# Patient Record
Sex: Male | Born: 1975
Health system: Southern US, Community
[De-identification: ages and names within clinical notes are randomized; demographics above are authoritative.]

## PROBLEM LIST (undated history)

## (undated) DIAGNOSIS — J349 Unspecified disorder of nose and nasal sinuses: Secondary | ICD-10-CM

## (undated) DIAGNOSIS — J329 Chronic sinusitis, unspecified: Secondary | ICD-10-CM

## (undated) DIAGNOSIS — R310 Gross hematuria: Secondary | ICD-10-CM

## (undated) DIAGNOSIS — R9431 Abnormal electrocardiogram [ECG] [EKG]: Secondary | ICD-10-CM

## (undated) DIAGNOSIS — G4733 Obstructive sleep apnea (adult) (pediatric): Secondary | ICD-10-CM

## (undated) HISTORY — PX: WISDOM TOOTH EXTRACTION: SHX21

## (undated) HISTORY — DX: Gross hematuria: R31.0

## (undated) HISTORY — DX: Obstructive sleep apnea (adult) (pediatric): G47.33

## (undated) HISTORY — DX: Abnormal electrocardiogram (ECG) (EKG): R94.31

## (undated) HISTORY — DX: Unspecified disorder of nose and nasal sinuses: J34.9

## (undated) HISTORY — PX: PILONIDAL CYST DRAINAGE: SHX743

---

## 2012-01-03 DIAGNOSIS — G4733 Obstructive sleep apnea (adult) (pediatric): Secondary | ICD-10-CM | POA: Insufficient documentation

## 2015-07-06 DIAGNOSIS — R310 Gross hematuria: Secondary | ICD-10-CM

## 2015-07-06 HISTORY — DX: Gross hematuria: R31.0

## 2015-09-24 DIAGNOSIS — K219 Gastro-esophageal reflux disease without esophagitis: Secondary | ICD-10-CM | POA: Insufficient documentation

## 2015-09-24 DIAGNOSIS — G2581 Restless legs syndrome: Secondary | ICD-10-CM | POA: Insufficient documentation

## 2015-10-23 DIAGNOSIS — G4733 Obstructive sleep apnea (adult) (pediatric): Secondary | ICD-10-CM | POA: Diagnosis not present

## 2015-12-04 DIAGNOSIS — G4733 Obstructive sleep apnea (adult) (pediatric): Secondary | ICD-10-CM

## 2015-12-04 HISTORY — DX: Obstructive sleep apnea (adult) (pediatric): G47.33

## 2015-12-09 DIAGNOSIS — G4733 Obstructive sleep apnea (adult) (pediatric): Secondary | ICD-10-CM | POA: Diagnosis not present

## 2015-12-09 DIAGNOSIS — Z9989 Dependence on other enabling machines and devices: Secondary | ICD-10-CM | POA: Diagnosis not present

## 2016-06-07 ENCOUNTER — Ambulatory Visit (INDEPENDENT_AMBULATORY_CARE_PROVIDER_SITE_OTHER): Payer: BLUE CROSS/BLUE SHIELD | Admitting: Family Medicine

## 2016-06-07 ENCOUNTER — Encounter: Payer: Self-pay | Admitting: Family Medicine

## 2016-06-07 VITALS — BP 140/78 | HR 82 | Temp 97.8°F | Resp 16 | Ht 71.5 in | Wt 224.5 lb

## 2016-06-07 DIAGNOSIS — Z23 Encounter for immunization: Secondary | ICD-10-CM

## 2016-06-07 DIAGNOSIS — R310 Gross hematuria: Secondary | ICD-10-CM

## 2016-06-07 DIAGNOSIS — Z0000 Encounter for general adult medical examination without abnormal findings: Secondary | ICD-10-CM

## 2016-06-07 LAB — URINALYSIS, ROUTINE W REFLEX MICROSCOPIC
Bilirubin Urine: NEGATIVE
Ketones, ur: NEGATIVE
Leukocytes, UA: NEGATIVE
Nitrite: NEGATIVE
PH: 5 (ref 5.0–8.0)
TOTAL PROTEIN, URINE-UPE24: NEGATIVE
Urine Glucose: NEGATIVE
Urobilinogen, UA: 0.2 (ref 0.0–1.0)

## 2016-06-07 LAB — POCT URINALYSIS DIPSTICK
Bilirubin, UA: NEGATIVE
GLUCOSE UA: NEGATIVE
Ketones, UA: NEGATIVE
LEUKOCYTES UA: NEGATIVE
NITRITE UA: NEGATIVE
PH UA: 5
Protein, UA: NEGATIVE
Spec Grav, UA: >=1.03
Urobilinogen, UA: 0.2

## 2016-06-07 NOTE — Progress Notes (Signed)
Office Note 06/07/2016  CC:  Chief Complaint  Patient presents with  . Establish Care  . Shoulder Pain    right, off and on x 1 year    HPI:  Danny Marsh is a 40 y.o. White male who is here to establish care. Patient's [facility], [place], [place]. Old records were not reviewed prior to or during today's visit.  No known FH of colon ca, bladder cancer, or prostate ca.  Pt reports hx of seeing blood in urine after he does a heavy workout---has occurred about 5 distinct times (all the same scenario).  No dysuria, no flank or groin pain. These episodes have all been over the last several months.  Past Medical History:  Diagnosis Date  . OSA (obstructive sleep apnea) 12/2015   Oral appliance works well    Past Surgical History:  Procedure Laterality Date  . PILONIDAL CYST DRAINAGE     x 2    Family History  Problem Relation Age of Onset  . Stroke Neg Hx   . Hearing loss Neg Hx   . Diabetes Neg Hx   . Cancer Neg Hx     Social History   Social History  . Marital status: Married    Spouse name: N/A  . Number of children: N/A  . Years of education: N/A   Occupational History  . Not on file.   Social History Main Topics  . Smoking status: Current Some Day Smoker    Types: Cigars  . Smokeless tobacco: Never Used  . Alcohol use Yes     Comment: 2-3 drinks a week, wine or beer  . Drug use: No  . Sexual activity: Not on file   Other Topics Concern  . Not on file   Social History Narrative   Married, 1 son.   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   Tob: "social smoker" x 10 yrs, about 1 pack per week.   Alc: 2-3 drinks per week, wine or beer.    No outpatient encounter prescriptions on file as of 06/07/2016.   No facility-administered encounter medications on file as of 06/07/2016.     No Known Allergies  ROS Review of Systems  Constitutional: Negative for appetite change, chills, fatigue and  fever.  HENT: Negative for congestion, dental problem, ear pain and sore throat.   Eyes: Negative for discharge, redness and visual disturbance.  Respiratory: Negative for cough, chest tightness, shortness of breath and wheezing.   Cardiovascular: Negative for chest pain, palpitations and leg swelling.  Gastrointestinal: Negative for abdominal pain, blood in stool, diarrhea, nausea and vomiting.  Genitourinary: Positive for hematuria. Negative for difficulty urinating, dysuria, flank pain, frequency and urgency.  Musculoskeletal: Negative for arthralgias, back pain, joint swelling, myalgias and neck stiffness.  Skin: Negative for pallor and rash.  Neurological: Negative for dizziness, speech difficulty, weakness and headaches.  Hematological: Negative for adenopathy. Does not bruise/bleed easily.  Psychiatric/Behavioral: Negative for confusion and sleep disturbance. The patient is not nervous/anxious.     PE; Blood pressure 140/78, pulse 82, temperature 97.8 F (36.6 C), temperature source Oral, resp. rate 16, height 5' 11.5" (1.816 m), weight 224 lb 8 oz (101.8 kg), SpO2 96 %. Gen: Alert, well appearing.  Patient is oriented to person, place, time, and situation. AFFECT: pleasant, lucid thought and speech. ENT: Ears: EACs clear, normal epithelium.  TMs with good light reflex and landmarks bilaterally.  Eyes: no injection, icteris, swelling, or exudate.  EOMI, PERRLA. Nose: no drainage or turbinate edema/swelling.  No injection or focal lesion.  Mouth: lips without lesion/swelling.  Oral mucosa pink and moist.  Dentition intact and without obvious caries or gingival swelling.  Oropharynx without erythema, exudate, or swelling.  Neck: supple/nontender.  No LAD, mass, or TM.  Carotid pulses 2+ bilaterally, without bruits. CV: RRR, no m/r/g.   LUNGS: CTA bilat, nonlabored resps, good aeration in all lung fields. ABD: soft, NT, ND, BS normal.  No hepatospenomegaly or mass.  No bruits. EXT: no  clubbing, cyanosis, or edema.  Musculoskeletal: no joint swelling, erythema, warmth, or tenderness.  ROM of all joints intact. Skin - no sores or suspicious lesions or rashes or color changes  Pertinent labs:   CC UA today: moderate blood, SG >1.030, otherwise normal  ASSESSMENT AND PLAN:   New pt; obtain prior PCP records.  1) Gross and microscopic hematuria: Arrange renal u/s. Pt wants to get in with urol before 06/18/16 due to travel and insurance reasons: will order referral and try to accomodate this. Send urine to lab today for microscopy. Hematuria due to vigorous exercise is dx of exclusion for this patient.  2) Health maintenance exam:  Reviewed age and gender appropriate health maintenance issues (prudent diet, regular exercise, health risks of tobacco and excessive alcohol, use of seatbelts, fire alarms in home, use of sunscreen).  Also reviewed age and gender appropriate health screening as well as vaccine recommendations. Tdap today.  Flu already UTD. Return for fasting HP labs--ordered.  An After Visit Summary was printed and given to the patient.  Return for follow up as needed.  Signed:  Santiago BumpersPhil McGowen, MD           06/07/2016

## 2016-06-07 NOTE — Progress Notes (Signed)
Pre visit review using our clinic review tool, if applicable. No additional management support is needed unless otherwise documented below in the visit note. 

## 2016-06-10 ENCOUNTER — Other Ambulatory Visit (INDEPENDENT_AMBULATORY_CARE_PROVIDER_SITE_OTHER): Payer: BLUE CROSS/BLUE SHIELD

## 2016-06-10 DIAGNOSIS — Z0000 Encounter for general adult medical examination without abnormal findings: Secondary | ICD-10-CM

## 2016-06-10 LAB — CBC WITH DIFFERENTIAL/PLATELET
BASOS ABS: 0 10*3/uL (ref 0.0–0.1)
BASOS PCT: 0.4 % (ref 0.0–3.0)
EOS PCT: 3.1 % (ref 0.0–5.0)
Eosinophils Absolute: 0.2 10*3/uL (ref 0.0–0.7)
HCT: 45.9 % (ref 39.0–52.0)
Hemoglobin: 15.6 g/dL (ref 13.0–17.0)
LYMPHS ABS: 2.8 10*3/uL (ref 0.7–4.0)
Lymphocytes Relative: 38.2 % (ref 12.0–46.0)
MCHC: 34.1 g/dL (ref 30.0–36.0)
MCV: 93.7 fl (ref 78.0–100.0)
MONOS PCT: 6.7 % (ref 3.0–12.0)
Monocytes Absolute: 0.5 10*3/uL (ref 0.1–1.0)
NEUTROS ABS: 3.8 10*3/uL (ref 1.4–7.7)
NEUTROS PCT: 51.6 % (ref 43.0–77.0)
PLATELETS: 253 10*3/uL (ref 150.0–400.0)
RBC: 4.9 Mil/uL (ref 4.22–5.81)
RDW: 12.8 % (ref 11.5–15.5)
WBC: 7.3 10*3/uL (ref 4.0–10.5)

## 2016-06-10 LAB — COMPREHENSIVE METABOLIC PANEL
ALBUMIN: 4.5 g/dL (ref 3.5–5.2)
ALK PHOS: 86 U/L (ref 39–117)
ALT: 26 U/L (ref 0–53)
AST: 22 U/L (ref 0–37)
BILIRUBIN TOTAL: 0.9 mg/dL (ref 0.2–1.2)
BUN: 15 mg/dL (ref 6–23)
CO2: 33 mEq/L — ABNORMAL HIGH (ref 19–32)
Calcium: 9.8 mg/dL (ref 8.4–10.5)
Chloride: 103 mEq/L (ref 96–112)
Creatinine, Ser: 1.03 mg/dL (ref 0.40–1.50)
GFR: 85.07 mL/min (ref >60.00)
GLUCOSE: 90 mg/dL (ref 70–99)
Potassium: 4.3 mEq/L (ref 3.5–5.1)
Sodium: 142 mEq/L (ref 135–145)
TOTAL PROTEIN: 7 g/dL (ref 6.0–8.3)

## 2016-06-10 LAB — LIPID PANEL
CHOLESTEROL: 174 mg/dL (ref 0–200)
HDL: 36.6 mg/dL — AB (ref >39.00)
LDL Cholesterol: 111 mg/dL — ABNORMAL HIGH (ref 0–99)
NONHDL: 137.26
TRIGLYCERIDES: 133 mg/dL (ref 0.0–149.0)
Total CHOL/HDL Ratio: 5
VLDL: 26.6 mg/dL (ref 0.0–40.0)

## 2016-06-10 LAB — TSH: TSH: 1.66 u[IU]/mL (ref 0.35–4.50)

## 2016-06-11 ENCOUNTER — Encounter: Payer: Self-pay | Admitting: Family Medicine

## 2016-06-15 ENCOUNTER — Ambulatory Visit (HOSPITAL_BASED_OUTPATIENT_CLINIC_OR_DEPARTMENT_OTHER)
Admission: RE | Admit: 2016-06-15 | Discharge: 2016-06-15 | Disposition: A | Discharge status: Home / Self Care | Payer: BLUE CROSS/BLUE SHIELD | Source: Ambulatory Visit | Attending: Family Medicine | Admitting: Family Medicine

## 2016-06-15 DIAGNOSIS — R310 Gross hematuria: Secondary | ICD-10-CM | POA: Insufficient documentation

## 2016-06-16 ENCOUNTER — Encounter: Payer: Self-pay | Admitting: Family Medicine

## 2016-06-17 ENCOUNTER — Encounter: Payer: Self-pay | Admitting: Family Medicine

## 2016-07-19 ENCOUNTER — Telehealth: Payer: Self-pay | Admitting: Family Medicine

## 2016-07-19 DIAGNOSIS — I4581 Long QT syndrome: Secondary | ICD-10-CM

## 2016-07-19 NOTE — Telephone Encounter (Signed)
Patient was made aware he could have a variation of Long QT syndrome around 25 years ago and because it is a genetic disease, patient would like to do further testing so that he can get his new born tested.  Patient would like a referral to DR Delrae RendJagadeesh Ganji, Cardiologist.  Okay for referral?

## 2016-07-19 NOTE — Telephone Encounter (Signed)
OK, referral ordered 

## 2016-08-06 DIAGNOSIS — R002 Palpitations: Secondary | ICD-10-CM | POA: Diagnosis not present

## 2016-08-09 ENCOUNTER — Encounter: Payer: Self-pay | Admitting: Family Medicine

## 2016-09-02 ENCOUNTER — Telehealth: Payer: Self-pay | Admitting: Family Medicine

## 2016-09-02 NOTE — Telephone Encounter (Signed)
Left message for patient to return call with specialist he prefers to see.

## 2016-09-02 NOTE — Telephone Encounter (Signed)
Patient would like to discuss additional options regarding the Urology referral Dr. Milinda CaveMcGowen placed for him a while ago.   He was not able to make his appointments with the Urology location and wants to consider a different one.  Thank you,  -LL

## 2016-09-07 NOTE — Telephone Encounter (Signed)
SW pt, he was unable to talk at this time, will call back later.

## 2016-09-07 NOTE — Telephone Encounter (Signed)
SW pt and he stated that he would like to be referred to a different urology office, currently referral is to [facility]. He stated that he is having scheduling problems with them and would like to go to a different urologist. He stated that his 1st apt was cancelled because office closed due to snow and when he called to reschedule they put his apt out another 6 weeks, then his work needed him to go out of town the week of his rescheduled apt so he called to reschedule and they said they couldn't get him in for another 6 weeks. Please advise. Thanks.   He stated that he will not be able to answer his phone until Thursday next week but its okay to leave a detailed message on his cel vm.

## 2016-09-08 NOTE — Telephone Encounter (Signed)
Can you try to get him in to see Dr. Edwyna ShellHart in [place]? If it is going to be > 6 wks to see Dr. Edwyna ShellHart, then pls try Urologist with [facility] in [place].-thx

## 2016-09-14 NOTE — Telephone Encounter (Signed)
Great, thank you!

## 2016-09-14 NOTE — Telephone Encounter (Signed)
I got him in with Dr. Edwyna ShellHart 10/07/16.

## 2016-09-22 ENCOUNTER — Ambulatory Visit (INDEPENDENT_AMBULATORY_CARE_PROVIDER_SITE_OTHER): Payer: BLUE CROSS/BLUE SHIELD | Admitting: Family Medicine

## 2016-09-22 ENCOUNTER — Encounter: Payer: Self-pay | Admitting: Family Medicine

## 2016-09-22 VITALS — BP 120/86 | HR 84 | Temp 98.3°F | Resp 16 | Ht 71.5 in | Wt 224.8 lb

## 2016-09-22 DIAGNOSIS — L249 Irritant contact dermatitis, unspecified cause: Secondary | ICD-10-CM | POA: Diagnosis not present

## 2016-09-22 MED ORDER — FLUTICASONE PROPIONATE 0.05 % EX CREA: TOPICAL_CREAM | CUTANEOUS | 1 refills | Status: DC

## 2016-09-22 NOTE — Progress Notes (Signed)
OFFICE VISIT  09/22/2016   CC:  Chief Complaint  Patient presents with  . Rash    on right leg x 5 days, gets worse after showering    HPI:    Patient is a 41 y.o.  male who presents for rash.   Rash noted on medial aspect of right knee noted about 5 days ago.  Very itchy, worse after shower. Extends up medial thigh some.  Applying otc hydrocortisone regularly and this does help temporarily. Returned from [place] 9[place]a.  No known contact with the typical contact irritants or allergens.  Past Medical History:  Diagnosis Date  . Abnormal EKG    Unclear hx: when pt approx 19 he had ? long QT.  Subsequent expert opinions supported dx of normal varian--no QT prolongation.  Eval by Dr. Jacinto HalimGanji again 08/06/16 NORMAL--no long QT syndrome (ekg age 41 likely PVC w/ early repol--normal variant for age)  . Gross hematuria 2017   Multiple episodes after vigorous exercise (myoglobinuria?).  Renal u/s 06/15/16 showed very small bilat kidney stones, nonobstructing.   . OSA (obstructive sleep apnea) 12/2015   Oral appliance works well    Past Surgical History:  Procedure Laterality Date  . PILONIDAL CYST DRAINAGE     x 2    No outpatient prescriptions prior to visit.   No facility-administered medications prior to visit.     No Known Allergies  ROS As per HPI  PE: Blood pressure 120/86, pulse 84, temperature 98.3 F (36.8 C), temperature source Oral, resp. rate 16, height 5' 11.5" (1.816 m), weight 224 lb 12 oz (101.9 kg), SpO2 97 %. Gen: Alert, well appearing.  Patient is oriented to person, place, time, and situation. AFFECT: pleasant, lucid thought and speech. SKIN: medial aspect of left knee and distal thigh with sparsely scattered pink papules about 1 mm in size. Some appear to be at the base of hair follicle but many others do not.  Some blanching noted with pressure.  No pustules or vesicles.  Non-tender to touch.  No streaking, no excessive warmth.  No background erythema.  No  excoriations.  LABS:  none  IMPRESSION AND PLAN:  Nonspecific dermatitis, likely to contact irritant pt unknowingly came into contact with. Rx'd generic cutivate 0.05% cream to apply bid to affected area. Signs/symptoms to call or return for were reviewed and pt expressed understanding.  An After Visit Summary was printed and given to the patient.  FOLLOW UP: Return if symptoms worsen or fail to improve.  Signed:  Santiago BumpersPhil Kathrin Folden, MD           09/22/2016

## 2016-09-22 NOTE — Progress Notes (Signed)
Pre visit review using our clinic review tool, if applicable. No additional management support is needed unless otherwise documented below in the visit note. 

## 2016-10-07 DIAGNOSIS — N200 Calculus of kidney: Secondary | ICD-10-CM | POA: Diagnosis not present

## 2016-10-07 DIAGNOSIS — R3129 Other microscopic hematuria: Secondary | ICD-10-CM | POA: Diagnosis not present

## 2016-10-14 ENCOUNTER — Telehealth: Payer: Self-pay | Admitting: Family Medicine

## 2016-10-14 ENCOUNTER — Encounter: Payer: Self-pay | Admitting: Family Medicine

## 2016-10-14 NOTE — Telephone Encounter (Signed)
Please advise. Thanks.  

## 2016-10-14 NOTE — Telephone Encounter (Signed)
Letter printed.

## 2016-10-14 NOTE — Telephone Encounter (Signed)
Patient states that his life insurance policy is going up due to our note stating that he is a current smoker x 10 year ( one pack weekly ).  Patient states that is not accurate.  He states that he has stopped smoking cigarettes 10 years ago and it was less than a pack weekly.  He also states he does smoke cigar but only socially maybe 2-3 times a year.  Can this please be updated and placed on our letter head.   Patient would like to pick this letter up when it is ready.  Please advise.

## 2016-10-14 NOTE — Telephone Encounter (Signed)
Patient advised.

## 2016-11-01 DIAGNOSIS — N281 Cyst of kidney, acquired: Secondary | ICD-10-CM | POA: Diagnosis not present

## 2016-11-01 DIAGNOSIS — R3129 Other microscopic hematuria: Secondary | ICD-10-CM | POA: Diagnosis not present

## 2016-11-01 DIAGNOSIS — R319 Hematuria, unspecified: Secondary | ICD-10-CM | POA: Diagnosis not present

## 2016-11-17 DIAGNOSIS — R3129 Other microscopic hematuria: Secondary | ICD-10-CM | POA: Diagnosis not present

## 2016-11-17 DIAGNOSIS — N281 Cyst of kidney, acquired: Secondary | ICD-10-CM | POA: Diagnosis not present

## 2016-12-17 DIAGNOSIS — Z87448 Personal history of other diseases of urinary system: Secondary | ICD-10-CM | POA: Diagnosis not present

## 2016-12-17 DIAGNOSIS — N281 Cyst of kidney, acquired: Secondary | ICD-10-CM | POA: Diagnosis not present

## 2016-12-17 DIAGNOSIS — R319 Hematuria, unspecified: Secondary | ICD-10-CM | POA: Diagnosis not present

## 2017-05-10 ENCOUNTER — Ambulatory Visit (INDEPENDENT_AMBULATORY_CARE_PROVIDER_SITE_OTHER): Payer: BLUE CROSS/BLUE SHIELD

## 2017-05-10 DIAGNOSIS — Z23 Encounter for immunization: Secondary | ICD-10-CM | POA: Diagnosis not present

## 2017-06-28 DIAGNOSIS — J069 Acute upper respiratory infection, unspecified: Secondary | ICD-10-CM | POA: Diagnosis not present

## 2017-06-29 DIAGNOSIS — J0100 Acute maxillary sinusitis, unspecified: Secondary | ICD-10-CM | POA: Diagnosis not present

## 2017-07-12 ENCOUNTER — Encounter: Payer: Self-pay | Admitting: Family Medicine

## 2017-07-12 ENCOUNTER — Ambulatory Visit (INDEPENDENT_AMBULATORY_CARE_PROVIDER_SITE_OTHER): Payer: BLUE CROSS/BLUE SHIELD | Admitting: Family Medicine

## 2017-07-12 VITALS — BP 104/72 | HR 92 | Temp 97.9°F | Wt 219.8 lb

## 2017-07-12 DIAGNOSIS — J0100 Acute maxillary sinusitis, unspecified: Secondary | ICD-10-CM

## 2017-07-12 MED ORDER — DOXYCYCLINE HYCLATE 100 MG PO TABS: 100.0000 mg | ORAL_TABLET | Freq: Two times a day (BID) | ORAL | 0 refills | Status: DC

## 2017-07-12 MED ORDER — BENZONATATE 200 MG PO CAPS: 200.0000 mg | ORAL_CAPSULE | Freq: Two times a day (BID) | ORAL | 0 refills | Status: DC | PRN

## 2017-07-12 NOTE — Progress Notes (Signed)
Danny Marsh , 08-01-1975, 42 y.o., male MRN: [id number] Patient Care Team    Relationship Specialty Notifications Start End  Danny Marsh, Danny Morn, MD PCP - General Family Medicine  06/07/16   Danny Decamp, MD Consulting Physician Cardiology  08/09/16     Chief Complaint  Patient presents with  . URI    pts complain of cough of productive cough that started on sunday after his resolve sinus infection that lasted for a week     Subjective: Pt presents for an OV with complaints of cough since Dec. 24th. He reports he had a sinus infection that was treated at [facility] with amox BID dosing. he feels his symptoms started to improve but after medications completed he is now experiencing worsening symptoms and productive cough and chills. He originally tried flonase and sudafed, but stop taking when he started abx.   Depression screen PHQ 2/9 07/12/2017  Decreased Interest 0  Down, Depressed, Hopeless 0  PHQ - 2 Score 0    No Known Allergies Social History   Tobacco Use  . Smoking status: Current Some Day Smoker    Types: Cigars  . Smokeless tobacco: Never Used  Substance Use Topics  . Alcohol use: Yes    Comment: 2-3 drinks a week, wine or beer   Past Medical History:  Diagnosis Date  . Abnormal EKG    Unclear hx: when pt approx 19 he had ? long QT.  Subsequent expert opinions supported dx of normal varian--no QT prolongation.  Eval by Dr. Jacinto Marsh again 08/06/16 NORMAL--no long QT syndrome (ekg age 29 likely PVC w/ early repol--normal variant for age)  . Gross hematuria 2017   Multiple episodes after vigorous exercise (myoglobinuria?).  Renal u/s 06/15/16 showed very small bilat kidney stones, nonobstructing.   . OSA (obstructive sleep apnea) 12/2015   Oral appliance works well   Past Surgical History:  Procedure Laterality Date  . PILONIDAL CYST DRAINAGE     x 2   Family History  Problem Relation Age of Onset  . Stroke Neg Hx   . Hearing loss Neg Hx   . Diabetes Neg Hx   . Cancer Neg  Hx    Allergies as of 07/12/2017   No Known Allergies     Medication List        Accurate as of 07/12/17 12:59 PM. Always use your most recent med list.          fluticasone 0.05 % cream Commonly known as:  CUTIVATE Apply to affected area twice a day as needed       All past medical history, surgical history, allergies, family history, immunizations andmedications were updated in the EMR today and reviewed under the history and medication portions of their EMR.     ROS: Negative, with the exception of above mentioned in HPI   Objective:  BP 104/72 (BP Location: Right Arm, Patient Position: Sitting, Cuff Size: Large)   Pulse 92   Temp 97.9 F (36.6 C) (Oral)   Wt 219 lb 12.8 oz (99.7 kg)   SpO2 95%   BMI 30.23 kg/m  Body mass index is 30.23 kg/m. Gen: Afebrile. No acute distress. Nontoxic in appearance, well developed, well nourished.  HENT: AT. [place]. Bilateral TM visualized without erythema or bulging. MMM, no oral lesions. Bilateral nares with erythema, swelling and drainage. Throat without erythema or exudates. No cough on exam. Mild hoarseness. TTP right max sinus.  Eyes:Pupils Equal Round Reactive to light, Extraocular movements intact,  Conjunctiva without redness, discharge or icterus. Neck/lymp/endocrine: Supple,no lymphadenopathy CV: RRR  Chest: CTAB, no wheeze or crackles. Good air movement, normal resp effort.  Abd: Soft. NTND. BS present. Skin: no rashes, purpura or petechiae.  Neuro:  Normal gait. PERLA. EOMi. Alert. Oriented x3   No exam data present No results found. No results found for this or any previous visit (from the past 24 hour(s)).  Assessment/Plan: Danny Marsh is a 42 y.o. male present for OV for  Acute non-recurrent maxillary sinusitis Rest, hydrate.  + flonase, mucinex (DM if cough), nettie pot or nasal saline.  doxycyline prescribed, take until completed. tessalon perles for cough.  If cough present it can last up to 6-8 weeks.  F/U 2  weeks if not improved.    Reviewed expectations re: course of current medical issues.  Discussed self-management of symptoms.  Outlined signs and symptoms indicating need for more acute intervention.  Patient verbalized understanding and all questions were answered.  Patient received an After-Visit Summary.    No orders of the defined types were placed in this encounter.    Note is dictated utilizing voice recognition software. Although note has been proof read prior to signing, occasional typographical errors still can be missed. If any questions arise, please do not hesitate to call for verification.   electronically signed by:  Danny Pacinienee Danny Casserly, DO  [facility] - [place]

## 2017-07-12 NOTE — Patient Instructions (Signed)
Rest, hydrate.  + flonase, mucinex (DM if cough), nettie pot or nasal saline.  doxycyline prescribed, take until completed. tessalon perles for cough.  If cough present it can last up to 6-8 weeks.  F/U 2 weeks of not improved.     Sinusitis, Adult Sinusitis is soreness and inflammation of your sinuses. Sinuses are hollow spaces in the bones around your face. They are located:  Around your eyes.  In the middle of your forehead.  Behind your nose.  In your cheekbones.  Your sinuses and nasal passages are lined with a stringy fluid (mucus). Mucus normally drains out of your sinuses. When your nasal tissues get inflamed or swollen, the mucus can get trapped or blocked so air cannot flow through your sinuses. This lets bacteria, viruses, and funguses grow, and that leads to infection. Follow these instructions at home: Medicines  Take, use, or apply over-the-counter and prescription medicines only as told by your doctor. These may include nasal sprays.  If you were prescribed an antibiotic medicine, take it as told by your doctor. Do not stop taking the antibiotic even if you start to feel better. Hydrate and Humidify  Drink enough water to keep your pee (urine) clear or pale yellow.  Use a cool mist humidifier to keep the humidity level in your home above 50%.  Breathe in steam for 10-15 minutes, 3-4 times a day or as told by your doctor. You can do this in the bathroom while a hot shower is running.  Try not to spend time in cool or dry air. Rest  Rest as much as possible.  Sleep with your head raised (elevated).  Make sure to get enough sleep each night. General instructions  Put a warm, moist washcloth on your face 3-4 times a day or as told by your doctor. This will help with discomfort.  Wash your hands often with soap and water. If there is no soap and water, use hand sanitizer.  Do not smoke. Avoid being around people who are smoking (secondhand smoke).  Keep all  follow-up visits as told by your doctor. This is important. Contact a doctor if:  You have a fever.  Your symptoms get worse.  Your symptoms do not get better within 10 days. Get help right away if:  You have a very bad headache.  You cannot stop throwing up (vomiting).  You have pain or swelling around your face or eyes.  You have trouble seeing.  You feel confused.  Your neck is stiff.  You have trouble breathing. This information is not intended to replace advice given to you by your health care provider. Make sure you discuss any questions you have with your health care provider. Document Released: 12/08/2007 Document Revised: 02/15/2016 Document Reviewed: 04/16/2015 Elsevier Interactive Patient Education  [employer]2018 Elsevier Inc.

## 2017-12-19 ENCOUNTER — Encounter: Payer: Self-pay | Admitting: Family Medicine

## 2017-12-19 ENCOUNTER — Ambulatory Visit (INDEPENDENT_AMBULATORY_CARE_PROVIDER_SITE_OTHER): Payer: BLUE CROSS/BLUE SHIELD | Admitting: Family Medicine

## 2017-12-19 VITALS — BP 119/81 | HR 82 | Temp 98.0°F | Resp 20 | Ht 71.5 in | Wt 217.5 lb

## 2017-12-19 DIAGNOSIS — J0100 Acute maxillary sinusitis, unspecified: Secondary | ICD-10-CM | POA: Diagnosis not present

## 2017-12-19 MED ORDER — PREDNISONE 50 MG PO TABS: 50.0000 mg | ORAL_TABLET | Freq: Every day | ORAL | 0 refills | Status: DC

## 2017-12-19 MED ORDER — DOXYCYCLINE HYCLATE 100 MG PO TABS: 100.0000 mg | ORAL_TABLET | Freq: Two times a day (BID) | ORAL | 0 refills | Status: DC

## 2017-12-19 NOTE — Progress Notes (Signed)
Danny Marsh , September 30, 1975, 42 y.o., male MRN: [id number] Patient Care Team    Relationship Specialty Notifications Start End  McGowen, Maryjean MornPhilip H, MD PCP - General Family Medicine  06/07/16   Yates DecampGanji, Jay, MD Consulting Physician Cardiology  08/09/16     Chief Complaint  Patient presents with  . URI    headache,odor from nose, facial pressure      Subjective: Pt presents for an OV with complaints of facial pressure of 10 days duration.  Associated symptoms include headache, metallic odor in nose, nasal congestion. He is eating and drinking well.  He denies fever, chills, nausea or vomit. He had a sinus infection in January resistant to Augmentin, did well with doxy.   Pt has tried sudafed, robitussin DM to ease their symptoms.   Depression screen PHQ 2/9 07/12/2017  Decreased Interest 0  Down, Depressed, Hopeless 0  PHQ - 2 Score 0    No Known Allergies Social History   Tobacco Use  . Smoking status: Current Some Day Smoker    Types: Cigars  . Smokeless tobacco: Never Used  Substance Use Topics  . Alcohol use: Yes    Comment: 2-3 drinks a week, wine or beer   Past Medical History:  Diagnosis Date  . Abnormal EKG    Unclear hx: when pt approx 19 he had ? long QT.  Subsequent expert opinions supported dx of normal varian--no QT prolongation.  Eval by Dr. Jacinto HalimGanji again 08/06/16 NORMAL--no long QT syndrome (ekg age 42 likely PVC w/ early repol--normal variant for age)  . Gross hematuria 2017   Multiple episodes after vigorous exercise (myoglobinuria?).  Renal u/s 06/15/16 showed very small bilat kidney stones, nonobstructing.   . OSA (obstructive sleep apnea) 12/2015   Oral appliance works well   Past Surgical History:  Procedure Laterality Date  . PILONIDAL CYST DRAINAGE     x 2   Family History  Problem Relation Age of Onset  . Stroke Neg Hx   . Hearing loss Neg Hx   . Diabetes Neg Hx   . Cancer Neg Hx    Allergies as of 12/19/2017   No Known Allergies     Medication  List    as of 12/19/2017  9:20 AM   You have not been prescribed any medications.     All past medical history, surgical history, allergies, family history, immunizations andmedications were updated in the EMR today and reviewed under the history and medication portions of their EMR.     ROS: Negative, with the exception of above mentioned in HPI   Objective:  BP 119/81 (BP Location: Left Arm, Patient Position: Sitting, Cuff Size: Large)   Pulse 82   Temp 98 F (36.7 C)   Resp 20   Ht 5' 11.5" (1.816 m)   Wt 217 lb 8 oz (98.7 kg)   SpO2 97%   BMI 29.91 kg/m  Body mass index is 29.91 kg/m. Gen: Afebrile. No acute distress. Nontoxic in appearance, well developed, well nourished.  HENT: AT. [place]. Bilateral TM visualized without erythema or fullness. MMM, no oral lesions. Bilateral nares with large red turbinates, drainage and swelling, mild odor present. Throat without erythema or exudates. PND present. TTP right max sinus with fullness.  Eyes:Pupils Equal Round Reactive to light, Extraocular movements intact,  Conjunctiva without redness, discharge or icterus. Neck/lymp/endocrine: Supple,no lymphadenopathy CV: RRR no murmur Chest: CTAB, no wheeze or crackles. Good air movement, normal resp effort.  Abd: Soft. NTND. BS present.  Skin: no rashes, purpura or petechiae.  Neuro:  Normal gait. PERLA. EOMi. Alert. Oriented x3    No exam data present No results found. No results found for this or any previous visit (from the past 24 hour(s)).  Assessment/Plan: Danny Marsh is a 42 y.o. male present for OV for  Acute non-recurrent maxillary sinusitis Rest, hydrate.  + flonase, mucinex (DM if cough),  nasal saline (flushes 3x a day while ill)  Doxycyline/predniosne prescribed, take until completed.  If cough present it can last up to 6-8 weeks.  F/U 2 weeks of not improved. If still having discomfort or odor, would consider ENT referral vs sinus xray.   Reviewed expectations re:  course of current medical issues.  Discussed self-management of symptoms.  Outlined signs and symptoms indicating need for more acute intervention.  Patient verbalized understanding and all questions were answered.  Patient received an After-Visit Summary.    No orders of the defined types were placed in this encounter.    Note is dictated utilizing voice recognition software. Although note has been proof read prior to signing, occasional typographical errors still can be missed. If any questions arise, please do not hesitate to call for verification.   electronically signed by:  Felix Pacini, DO   [facility] - [place]

## 2017-12-19 NOTE — Patient Instructions (Addendum)
Rest, hydrate.  + flonase, mucinex (DM if cough),  nasal saline (flushes 3x a day while ill)  Doxycyline  prescribed, take until completed.  If cough present it can last up to 6-8 weeks.  F/U 2 weeks of not improved.    Sinusitis, Adult Sinusitis is soreness and inflammation of your sinuses. Sinuses are hollow spaces in the bones around your face. They are located:  Around your eyes.  In the middle of your forehead.  Behind your nose.  In your cheekbones.  Your sinuses and nasal passages are lined with a stringy fluid (mucus). Mucus normally drains out of your sinuses. When your nasal tissues get inflamed or swollen, the mucus can get trapped or blocked so air cannot flow through your sinuses. This lets bacteria, viruses, and funguses grow, and that leads to infection. Follow these instructions at home: Medicines  Take, use, or apply over-the-counter and prescription medicines only as told by your doctor. These may include nasal sprays.  If you were prescribed an antibiotic medicine, take it as told by your doctor. Do not stop taking the antibiotic even if you start to feel better. Hydrate and Humidify  Drink enough water to keep your pee (urine) clear or pale yellow.  Use a cool mist humidifier to keep the humidity level in your home above 50%.  Breathe in steam for 10-15 minutes, 3-4 times a day or as told by your doctor. You can do this in the bathroom while a hot shower is running.  Try not to spend time in cool or dry air. Rest  Rest as much as possible.  Sleep with your head raised (elevated).  Make sure to get enough sleep each night. General instructions  Put a warm, moist washcloth on your face 3-4 times a day or as told by your doctor. This will help with discomfort.  Wash your hands often with soap and water. If there is no soap and water, use hand sanitizer.  Do not smoke. Avoid being around people who are smoking (secondhand smoke).  Keep all follow-up  visits as told by your doctor. This is important. Contact a doctor if:  You have a fever.  Your symptoms get worse.  Your symptoms do not get better within 10 days. Get help right away if:  You have a very bad headache.  You cannot stop throwing up (vomiting).  You have pain or swelling around your face or eyes.  You have trouble seeing.  You feel confused.  Your neck is stiff.  You have trouble breathing. This information is not intended to replace advice given to you by your health care provider. Make sure you discuss any questions you have with your health care provider. Document Released: 12/08/2007 Document Revised: 02/15/2016 Document Reviewed: 04/16/2015 Elsevier Interactive Patient Education  [employer]2018 [employer]

## 2018-02-21 ENCOUNTER — Ambulatory Visit: Payer: BLUE CROSS/BLUE SHIELD | Admitting: Family Medicine

## 2018-03-13 ENCOUNTER — Ambulatory Visit (HOSPITAL_BASED_OUTPATIENT_CLINIC_OR_DEPARTMENT_OTHER)
Admission: RE | Admit: 2018-03-13 | Discharge: 2018-03-13 | Disposition: A | Discharge status: Home / Self Care | Payer: BLUE CROSS/BLUE SHIELD | Source: Ambulatory Visit | Attending: Family Medicine | Admitting: Family Medicine

## 2018-03-13 ENCOUNTER — Telehealth: Payer: Self-pay | Admitting: Family Medicine

## 2018-03-13 ENCOUNTER — Encounter: Payer: Self-pay | Admitting: Family Medicine

## 2018-03-13 ENCOUNTER — Ambulatory Visit (INDEPENDENT_AMBULATORY_CARE_PROVIDER_SITE_OTHER): Payer: BLUE CROSS/BLUE SHIELD | Admitting: Family Medicine

## 2018-03-13 VITALS — BP 130/79 | HR 86 | Temp 98.3°F | Resp 20 | Ht 71.5 in | Wt 219.0 lb

## 2018-03-13 DIAGNOSIS — J320 Chronic maxillary sinusitis: Secondary | ICD-10-CM

## 2018-03-13 DIAGNOSIS — R220 Localized swelling, mass and lump, head: Secondary | ICD-10-CM

## 2018-03-13 DIAGNOSIS — R930 Abnormal findings on diagnostic imaging of skull and head, not elsewhere classified: Secondary | ICD-10-CM

## 2018-03-13 MED ORDER — DOXYCYCLINE HYCLATE 100 MG PO TABS: 100.0000 mg | ORAL_TABLET | Freq: Two times a day (BID) | ORAL | 0 refills | Status: DC

## 2018-03-13 NOTE — Progress Notes (Signed)
Danny Marsh , 01-04-1976, 42 y.o., male MRN: [id number] Patient Care Team    Relationship Specialty Notifications Start End  McGowen, Maryjean Morn, MD PCP - General Family Medicine  06/07/16   Yates Decamp, MD Consulting Physician Cardiology  08/09/16     Chief Complaint  Patient presents with  . URI    metallic smell and swelling under right eye      Subjective: Pt presents for an OV with complaints of right sided sinus discomfort of 2 days duration.  Associated symptoms include swelling under right eye. He has had frequent bought of sinus swelling and infections the last year. He reports he gets a metallic smell when his sinus infections occur. Swelling is typically right side of maxillary sinus. He does have resolution of symptoms after treatment. Last treated with doxy and prednisone. He does not take a daily antihistamine or use nasal sprays.He reports having the onset of these symptoms over the weekend. Today the metallic smell is gone and and the facial swelling is improved. He denies fever, chills, nausea, sinus drainage or bloody nose.  Depression screen PHQ 2/9 07/12/2017  Decreased Interest 0  Down, Depressed, Hopeless 0  PHQ - 2 Score 0    No Known Allergies Social History   Tobacco Use  . Smoking status: Current Some Day Smoker    Types: Cigars  . Smokeless tobacco: Never Used  Substance Use Topics  . Alcohol use: Yes    Comment: 2-3 drinks a week, wine or beer   Past Medical History:  Diagnosis Date  . Abnormal EKG    Unclear hx: when pt approx 19 he had ? long QT.  Subsequent expert opinions supported dx of normal varian--no QT prolongation.  Eval by Dr. Jacinto Halim again 08/06/16 NORMAL--no long QT syndrome (ekg age 40 likely PVC w/ early repol--normal variant for age)  . Gross hematuria 2017   Multiple episodes after vigorous exercise (myoglobinuria?).  Renal u/s 06/15/16 showed very small bilat kidney stones, nonobstructing.   . OSA (obstructive sleep apnea) 12/2015   Oral  appliance works well   Past Surgical History:  Procedure Laterality Date  . PILONIDAL CYST DRAINAGE     x 2   Family History  Problem Relation Age of Onset  . Stroke Neg Hx   . Hearing loss Neg Hx   . Diabetes Neg Hx   . Cancer Neg Hx    Allergies as of 03/13/2018   No Known Allergies     Medication List    as of 03/13/2018 11:32 AM   You have not been prescribed any medications.     All past medical history, surgical history, allergies, family history, immunizations andmedications were updated in the EMR today and reviewed under the history and medication portions of their EMR.     ROS: Negative, with the exception of above mentioned in HPI   Objective:  BP 130/79 (BP Location: Right Arm, Patient Position: Sitting, Cuff Size: Large)   Pulse 86   Temp 98.3 F (36.8 C)   Resp 20   Ht 5' 11.5" (1.816 m)   Wt 219 lb (99.3 kg)   SpO2 97%   BMI 30.12 kg/m  Body mass index is 30.12 kg/m. Gen: Afebrile. No acute distress. Nontoxic in appearance, well developed, well nourished.  HENT: AT. [place]. Bilateral TM visualized without erythema or fullness. MMM, no oral lesions. Bilateral nares with erythema, swelling R>L. Mild facial swelling right max sinus present. Throat without erythema or exudates. No  cough or hoarseness. TTP right max sinus.  Eyes:Pupils Equal Round Reactive to light, Extraocular movements intact,  Conjunctiva without redness, discharge or icterus. Neck/lymp/endocrine: Supple,no lymphadenopathy CV: RRR Chest: CTAB, no wheeze or crackles.  Abd: Soft. NTND. BS present.  Skin: no rashes, purpura or petechiae.  Neuro:  Normal gait. PERLA. EOMi. Alert. Oriented x3   No exam data present No results found. No results found for this or any previous visit (from the past 24 hour(s)).  Assessment/Plan: Danny Marsh is a 42 y.o. male present for OV for  Chronic maxillary sinusitis - Discussed the need for daily allergy regimen. Start flonase daily, zyrtec or xyzal  nightly.  - nasal saline flushes at least x2 daily.  - Doxy script to start if symptoms worsening or fever, chills, drainage begins. - DG Sinuses Complete; Future - referral to ENT for further eval of chronic sinusitis.  - F/U 2 weeks PRN.    Reviewed expectations re: course of current medical issues.  Discussed self-management of symptoms.  Outlined signs and symptoms indicating need for more acute intervention.  Patient verbalized understanding and all questions were answered.  Patient received an After-Visit Summary.    No orders of the defined types were placed in this encounter.    Note is dictated utilizing voice recognition software. Although note has been proof read prior to signing, occasional typographical errors still can be missed. If any questions arise, please do not hesitate to call for verification.   electronically signed by:  Felix Pacini, DO  [facility] - [place]

## 2018-03-13 NOTE — Telephone Encounter (Signed)
Please inform patient the following information: His sinus xray resulted with Opacification of the right maxillary sinus. These means on xray the sinus appeared completely full on the right. This warrants further image with CT to obtain a better picture of the sinus cavity looking for cause such as a mass or obstruction.  - I have ordered this CT for him. It is with contrast, so he will need a kidney function/blood work prior. Please have him schedule lab visit ASAP so we can proceed with getting the CT scheduled.  - Hope to have results prior to ENT appt so they can have results as well.

## 2018-03-13 NOTE — Patient Instructions (Addendum)
flonase once a day and start daily zyrtec or xyzal (at night). Use nasal saline to flush sinus cavity twice daily.  You have signs of chronic sinusitis.  If worsening start antibiotic printed.  Please have xray of sinus today at [facility] point off of [address].  Referred you back to Dr. Pollyann Kennedy to discuss your chronic sinusitis. They will call to schedule.    Sinusitis, Adult Sinusitis is soreness and inflammation of your sinuses. Sinuses are hollow spaces in the bones around your face. They are located:  Around your eyes.  In the middle of your forehead.  Behind your nose.  In your cheekbones.  Your sinuses and nasal passages are lined with a stringy fluid (mucus). Mucus normally drains out of your sinuses. When your nasal tissues get inflamed or swollen, the mucus can get trapped or blocked so air cannot flow through your sinuses. This lets bacteria, viruses, and funguses grow, and that leads to infection. Follow these instructions at home: Medicines  Take, use, or apply over-the-counter and prescription medicines only as told by your doctor. These may include nasal sprays.  If you were prescribed an antibiotic medicine, take it as told by your doctor. Do not stop taking the antibiotic even if you start to feel better. Hydrate and Humidify  Drink enough water to keep your pee (urine) clear or pale yellow.  Use a cool mist humidifier to keep the humidity level in your home above 50%.  Breathe in steam for 10-15 minutes, 3-4 times a day or as told by your doctor. You can do this in the bathroom while a hot shower is running.  Try not to spend time in cool or dry air. Rest  Rest as much as possible.  Sleep with your head raised (elevated).  Make sure to get enough sleep each night. General instructions  Put a warm, moist washcloth on your face 3-4 times a day or as told by your doctor. This will help with discomfort.  Wash your hands often with soap and water. If there is  no soap and water, use hand sanitizer.  Do not smoke. Avoid being around people who are smoking (secondhand smoke).  Keep all follow-up visits as told by your doctor. This is important. Contact a doctor if:  You have a fever.  Your symptoms get worse.  Your symptoms do not get better within 10 days. Get help right away if:  You have a very bad headache.  You cannot stop throwing up (vomiting).  You have pain or swelling around your face or eyes.  You have trouble seeing.  You feel confused.  Your neck is stiff.  You have trouble breathing. This information is not intended to replace advice given to you by your health care provider. Make sure you discuss any questions you have with your health care provider. Document Released: 12/08/2007 Document Revised: 02/15/2016 Document Reviewed: 04/16/2015 Elsevier Interactive Patient Education  [employer].

## 2018-03-14 NOTE — Telephone Encounter (Signed)
Spoke with patient reviewed xray results ,instructions and information. Patient verbalized understanding.patient scheduled for lab appt .

## 2018-03-16 ENCOUNTER — Other Ambulatory Visit (INDEPENDENT_AMBULATORY_CARE_PROVIDER_SITE_OTHER): Payer: BLUE CROSS/BLUE SHIELD

## 2018-03-16 DIAGNOSIS — R220 Localized swelling, mass and lump, head: Secondary | ICD-10-CM

## 2018-03-16 LAB — BASIC METABOLIC PANEL
BUN: 17 mg/dL (ref 6–23)
CHLORIDE: 102 meq/L (ref 96–112)
CO2: 27 mEq/L (ref 19–32)
Calcium: 9.9 mg/dL (ref 8.4–10.5)
Creatinine, Ser: 1.02 mg/dL (ref 0.40–1.50)
GFR: 85.28 mL/min (ref >60.00)
GLUCOSE: 99 mg/dL (ref 70–99)
Potassium: 4.2 mEq/L (ref 3.5–5.1)
SODIUM: 137 meq/L (ref 135–145)

## 2018-03-20 ENCOUNTER — Encounter: Payer: Self-pay | Admitting: Radiology

## 2018-03-20 ENCOUNTER — Ambulatory Visit
Admission: RE | Admit: 2018-03-20 | Discharge: 2018-03-20 | Disposition: A | Discharge status: Home / Self Care | Payer: BLUE CROSS/BLUE SHIELD | Source: Ambulatory Visit | Attending: Family Medicine | Admitting: Family Medicine

## 2018-03-20 DIAGNOSIS — R220 Localized swelling, mass and lump, head: Secondary | ICD-10-CM | POA: Diagnosis not present

## 2018-03-20 DIAGNOSIS — R930 Abnormal findings on diagnostic imaging of skull and head, not elsewhere classified: Secondary | ICD-10-CM

## 2018-03-20 MED ORDER — IOPAMIDOL (ISOVUE-300) INJECTION 61%
75.0000 mL | Freq: Once | INTRAVENOUS | Status: AC | PRN
  Administered 2018-03-20: 75 mL via INTRAVENOUS

## 2018-03-23 ENCOUNTER — Telehealth: Payer: Self-pay | Admitting: Family Medicine

## 2018-03-23 ENCOUNTER — Encounter: Payer: Self-pay | Admitting: Family Medicine

## 2018-03-23 DIAGNOSIS — J3489 Other specified disorders of nose and nasal sinuses: Secondary | ICD-10-CM | POA: Insufficient documentation

## 2018-03-23 MED ORDER — DOXYCYCLINE HYCLATE 100 MG PO TABS: 100.0000 mg | ORAL_TABLET | Freq: Two times a day (BID) | ORAL | 0 refills | Status: DC

## 2018-03-23 NOTE — Telephone Encounter (Signed)
Copy made and placed at front desk for pick up

## 2018-03-23 NOTE — Telephone Encounter (Signed)
Called pt and discussed findings with him. He does have ENT set up for next week. He was encouraged to call his Dentist and be seen/referred to an odontist.  A copy of the report of facial imaging will ne placed at front desk for him to pick up and take to Dentist.   Rosalita ChessmanSuzanne please make copy and place at front desk for pick up

## 2018-04-04 DIAGNOSIS — Z8669 Personal history of other diseases of the nervous system and sense organs: Secondary | ICD-10-CM | POA: Diagnosis not present

## 2018-04-04 DIAGNOSIS — J320 Chronic maxillary sinusitis: Secondary | ICD-10-CM | POA: Diagnosis not present

## 2018-04-07 ENCOUNTER — Encounter: Payer: Self-pay | Admitting: Family Medicine

## 2018-04-17 DIAGNOSIS — J322 Chronic ethmoidal sinusitis: Secondary | ICD-10-CM | POA: Diagnosis not present

## 2018-04-17 DIAGNOSIS — J329 Chronic sinusitis, unspecified: Secondary | ICD-10-CM | POA: Diagnosis not present

## 2018-04-17 DIAGNOSIS — J320 Chronic maxillary sinusitis: Secondary | ICD-10-CM | POA: Diagnosis not present

## 2018-04-17 DIAGNOSIS — Z8669 Personal history of other diseases of the nervous system and sense organs: Secondary | ICD-10-CM | POA: Diagnosis not present

## 2018-05-22 ENCOUNTER — Ambulatory Visit (INDEPENDENT_AMBULATORY_CARE_PROVIDER_SITE_OTHER): Payer: BLUE CROSS/BLUE SHIELD

## 2018-05-22 DIAGNOSIS — Z23 Encounter for immunization: Secondary | ICD-10-CM | POA: Diagnosis not present

## 2018-05-24 NOTE — H&P (Signed)
HPI:   Danny Marsh is a 42 y.o. male who presents as a return Patient.   Referring Provider: Michell Heinrich, *  Chief complaint: Sinusitis.  HPI: Since I saw him a couple of years ago, he was treated for sleep apnea with an oral appliance and seems to be doing well with that. Over the past couple of years he has had several episodes of right maxillary sinusitis manifested by foul-smelling nasal discharge. He has gotten better with antibiotics. In between infections he feels fine. He had his last round of antibiotic a few weeks ago followed by a CT scan which revealed complete opacification of the right maxillary sinus and obstruction of the outflow tract. Remaining sinuses were clear. There is a suspicion of a right maxillary molar root that may be causing part of the problem. He does not have any dental symptoms. He is currently on day 7 of a 10-day course of doxycycline.  PMH/Meds/All/SocHx/FamHx/ROS:   Past Medical History:  Diagnosis Date  . Deviated nasal septum  . GERD (gastroesophageal reflux disease)  . History of restless legs syndrome  . Long QT interval  . Obstructive sleep apnea  . RLS (restless legs syndrome)  . Snoring   Past Surgical History:  Procedure Laterality Date  . CYST REMOVAL  . PILONIDAL CYST DRAINAGE  . WISDOM TOOTH EXTRACTION   No family history of bleeding disorders, wound healing problems or difficulty with anesthesia.   Social History   Socioeconomic History  . Marital status: Married  Spouse name: Not on file  . Number of children: Not on file  . Years of education: Not on file  . Highest education level: Not on file  Occupational History  . Not on file  Social Needs  . Financial resource strain: Not on file  . Food insecurity:  Worry: Not on file  Inability: Not on file  . Transportation needs:  Medical: Not on file  Non-medical: Not on file  Tobacco Use  . Smoking status: Current Every Day Smoker  Packs/day: 0.50  Years: 7.00   Pack years: 3.50  Types: Cigarettes  . Smokeless tobacco: Never Used  Substance and Sexual Activity  . Alcohol use: Yes  Alcohol/week: 12.0 oz  Types: 1 Cans of beer (12 oz./can) per week  Comment: 2 drinks day or fewer  . Drug use: No  . Sexual activity: Yes  Partners: Female  Comment: Married x 6 yrs  Lifestyle  . Physical activity:  Days per week: Not on file  Minutes per session: Not on file  . Stress: Not on file  Relationships  . Social connections:  Talks on phone: Not on file  Gets together: Not on file  Attends religious service: Not on file  Active member of club or organization: Not on file  Attends meetings of clubs or organizations: Not on file  Relationship status: Not on file  Other Topics Concern  . Not on file  Social History Narrative  . Not on file   Current Outpatient Medications:  . doxycycline hyclate (VIBRA-TABS) 100 MG tablet, Take 100 mg by mouth., Disp: , Rfl:  . omeprazole (PRILOSEC) 10 MG capsule, Take 10 mg by mouth daily., Disp: , Rfl:  . ranitidine (ZANTAC) 150 MG tablet, ranitidine 150 mg tablet TK 1 T PO QD HS FOR 90 DAYS, Disp: , Rfl:   A complete ROS was performed with pertinent positives/negatives noted in the HPI. The remainder of the ROS are negative.   Physical Exam:  Ht 1.829 m (6')  Wt 97.5 kg (215 lb)  BMI 29.16 kg/m   General: Healthy and alert, in no distress, breathing easily. Normal affect. In a pleasant mood. Head: Normocephalic, atraumatic. No masses, or scars. Eyes: Pupils are equal, and reactive to light. Vision is grossly intact. No spontaneous or gaze nystagmus. Hearing: Grossly normal. Nose: Nasal cavities are clear with healthy mucosa, no polyps or exudate. Airways are moderately restricted secondary to septal deviation. Face: No masses or scars, facial nerve function is symmetric. Oral Cavity: No mucosal abnormalities are noted. Tongue with normal mobility. Dentition appears healthy. Oropharynx: There are no  mucosal masses identified. Tongue base appears normal and healthy. Larynx/Hypopharynx: deferred Chest: Deferred Neck: No palpable masses, no cervical adenopathy, no thyroid nodules or enlargement. Neuro: Cranial nerves II-XII with normal function. Balance: Normal gate. Other findings: none.  Independent Review of Additional Tests or Records:  none  Procedures:  none  Impression & Plans:  Recurrent and chronic right maxillary sinusitis. Recommend we stop the doxycycline, switch to clindamycin and treat for 2 weeks and then follow-up with an additional CT imaging to see if there is residual disease. It is likely that he is going to require surgical intervention.

## 2018-05-25 NOTE — Pre-Procedure Instructions (Signed)
Dolores Loryrrol Blacketer  05/25/2018      [id number] - [place], [place] - [address] [place] [id number] Phone: [phone] Fax: [phone]  [id number] - [place], [place] - [address] [place] [id number] Phone: [phone] Fax: [phone]    Your procedure is scheduled on Wednesday December 4th.  [facility] 0830 A.M.  Call this number if you have problems the morning of surgery:  [phone]   Remember:  Do not eat or drink after midnight.    Take these medicines the morning of surgery with A SIP OF WATER   Tylenol (If needed)  7 days prior to surgery STOP taking any Aspirin(unless otherwise instructed by your surgeon), Aleve, Naproxen, Ibuprofen, Motrin, Advil, Goody's, BC's, all herbal medications, fish oil, and all vitamins.    Do not wear jewelry.  Do not wear lotions, powders, or colognes, or deodorant.  Men may shave face and neck.  Do not bring valuables to the hospital.  [facility] is not responsible for any belongings or valuables.  Contacts, dentures or bridgework may not be worn into surgery.  Leave your suitcase in the car.  After surgery it may be brought to your room.  For patients admitted to the hospital, discharge time will be determined by your treatment team.  Patients discharged the day of surgery will not be allowed to drive home.    [facility]- Preparing For Surgery  Before surgery, you can play an important role. Because skin is not sterile, your skin needs to be as free of germs as possible. You can reduce the number of germs on your skin by washing with CHG (chlorahexidine gluconate) Soap before surgery.  CHG is an antiseptic cleaner which kills germs and bonds with the skin to continue killing germs even after washing.    Oral Hygiene is also important to reduce your risk of infection.  Remember - BRUSH YOUR  TEETH THE MORNING OF SURGERY WITH YOUR REGULAR TOOTHPASTE  Please do not use if you have an allergy to CHG or antibacterial soaps. If your skin becomes reddened/irritated stop using the CHG.  Do not shave (including legs and underarms) for at least 48 hours prior to first CHG shower. It is OK to shave your face.  Please follow these instructions carefully.   1. Shower the NIGHT BEFORE SURGERY and the MORNING OF SURGERY with CHG.   2. If you chose to wash your hair, wash your hair first as usual with your normal shampoo.  3. After you shampoo, rinse your hair and body thoroughly to remove the shampoo.  4. Use CHG as you would any other liquid soap. You can apply CHG directly to the skin and wash gently with a scrungie or a clean washcloth.   5. Apply the CHG Soap to your body ONLY FROM THE NECK DOWN.  Do not use on open wounds or open sores. Avoid contact with your eyes, ears, mouth and genitals (private parts). Wash Face and genitals (private parts)  with your normal soap.  6. Wash thoroughly, paying special attention to the area where your surgery will be performed.  7. Thoroughly rinse your body with warm water from the neck down.  8. DO NOT shower/wash with your normal soap after using and rinsing off the CHG Soap.  9. Pat yourself dry with a CLEAN TOWEL.  10. Wear CLEAN PAJAMAS to bed the night before surgery, wear comfortable clothes the morning of surgery  11. Place CLEAN SHEETS on your bed the night of your first shower and DO NOT SLEEP WITH PETS.    Day of Surgery:  Do not apply any deodorants/lotions.  Please wear clean clothes to the hospital/surgery center.   Remember to brush your teeth WITH YOUR REGULAR TOOTHPASTE.    Please read over the following fact sheets that you were given.

## 2018-05-26 ENCOUNTER — Encounter (HOSPITAL_COMMUNITY): Payer: Self-pay

## 2018-05-26 ENCOUNTER — Other Ambulatory Visit: Payer: Self-pay

## 2018-05-26 ENCOUNTER — Encounter (HOSPITAL_COMMUNITY)
Admission: RE | Admit: 2018-05-26 | Discharge: 2018-05-26 | Disposition: A | Discharge status: Home / Self Care | Payer: BLUE CROSS/BLUE SHIELD | Source: Ambulatory Visit | Attending: Otolaryngology | Admitting: Otolaryngology

## 2018-05-26 DIAGNOSIS — Z01818 Encounter for other preprocedural examination: Secondary | ICD-10-CM | POA: Diagnosis not present

## 2018-05-26 LAB — CBC
HEMATOCRIT: 45.6 % (ref 39.0–52.0)
Hemoglobin: 14.9 g/dL (ref 13.0–17.0)
MCH: 30.2 pg (ref 26.0–34.0)
MCHC: 32.7 g/dL (ref 30.0–36.0)
MCV: 92.3 fL (ref 80.0–100.0)
NRBC: 0 % (ref 0.0–0.2)
PLATELETS: 299 10*3/uL (ref 150–400)
RBC: 4.94 MIL/uL (ref 4.22–5.81)
RDW: 11.4 % — AB (ref 11.5–15.5)
WBC: 7.9 10*3/uL (ref 4.0–10.5)

## 2018-05-26 NOTE — Progress Notes (Signed)
PCP: Nicoletta BaPhilip McGowen  Cardiologist: Dr. Jacinto HalimGanji - only seen once for history of abnormal EKG Last EKG done was last year - 2018, findings were normal. Requesting records from Dr. Verl DickerGanji's office.  Pt states he has a history of having a strange QT wave about 20 years ago.  Pt states he no longer has issue and following EKG's have been normal.  DM: denies  Sleep Apnea: yes, wears mouth appliance.  Does not wear CPAP.   Pt denies SOB, chest pain, cough, fever.  Pt states understanding of instructions given for day of surgery.

## 2018-05-26 NOTE — Pre-Procedure Instructions (Signed)
Danny Marsh  05/26/2018      [id number] - [place], [place] - [address] [place] [id number] Phone: [phone] Fax: [phone]  [id number] - [place], [place] - [address] [place] [id number] Phone: [phone] Fax: [phone]    Your procedure is scheduled on Wednesday December 4th.  [facility] Admitting at 8:30 A.M.  Call this number if you have problems the morning of surgery:  [phone]   Remember:  Do not eat or drink after midnight.    Take these medicines the morning of surgery with A SIP OF WATER:    Tylenol (If needed)  7 days prior to surgery STOP taking any Aspirin(unless otherwise instructed by your surgeon), Aleve, Naproxen, Ibuprofen, Motrin, Advil, Goody's, BC's, all herbal medications, fish oil, and all vitamins.    Do not wear jewelry.  Do not wear lotions, powders, or colognes, or deodorant.  Men may shave face and neck.  Do not bring valuables to the hospital.  [facility] is not responsible for any belongings or valuables.  Contacts, dentures or bridgework may not be worn into surgery.  Leave your suitcase in the car.  After surgery it may be brought to your room.  For patients admitted to the hospital, discharge time will be determined by your treatment team.  Patients discharged the day of surgery will not be allowed to drive home.    [facility]- Preparing For Surgery  Before surgery, you can play an important role. Because skin is not sterile, your skin needs to be as free of germs as possible. You can reduce the number of germs on your skin by washing with CHG (chlorahexidine gluconate) Soap before surgery.  CHG is an antiseptic cleaner which kills germs and bonds with the skin to continue killing germs even after washing.    Oral Hygiene is also important to reduce your risk of infection.  Remember - BRUSH  YOUR TEETH THE MORNING OF SURGERY WITH YOUR REGULAR TOOTHPASTE  Please do not use if you have an allergy to CHG or antibacterial soaps. If your skin becomes reddened/irritated stop using the CHG.  Do not shave (including legs and underarms) for at least 48 hours prior to first CHG shower. It is OK to shave your face.  Please follow these instructions carefully.   1. Shower the NIGHT BEFORE SURGERY and the MORNING OF SURGERY with CHG.   2. If you chose to wash your hair, wash your hair first as usual with your normal shampoo.  3. After you shampoo, rinse your hair and body thoroughly to remove the shampoo.  4. Use CHG as you would any other liquid soap. You can apply CHG directly to the skin and wash gently with a scrungie or a clean washcloth.   5. Apply the CHG Soap to your body ONLY FROM THE NECK DOWN.  Do not use on open wounds or open sores. Avoid contact with your eyes, ears, mouth and genitals (private parts). Wash Face and genitals (private parts)  with your normal soap.  6. Wash thoroughly, paying special attention to the area where your surgery will be performed.  7. Thoroughly rinse your body with warm water from the neck down.  8. DO NOT shower/wash with your normal soap after using and rinsing off the CHG Soap.  9. Pat yourself dry with a CLEAN  TOWEL.  10. Wear CLEAN PAJAMAS to bed the night before surgery, wear comfortable clothes the morning of surgery  11. Place CLEAN SHEETS on your bed the night of your first shower and DO NOT SLEEP WITH PETS.   Day of Surgery:  Do not apply any deodorants/lotions.  Please wear clean clothes to the hospital/surgery center.   Remember to brush your teeth WITH YOUR REGULAR TOOTHPASTE.

## 2018-05-29 NOTE — Anesthesia Preprocedure Evaluation (Addendum)
Anesthesia Evaluation  Patient identified by MRN, date of birth, ID band Patient awake    Reviewed: Allergy & Precautions, NPO status , Patient's Chart, lab work & pertinent test results  Airway Mallampati: IV  TM Distance: >3 FB Neck ROM: Full    Dental  (+) Teeth Intact, Dental Advisory Given   Pulmonary sleep apnea , Current Smoker,    breath sounds clear to auscultation       Cardiovascular negative cardio ROS   Rhythm:Regular Rate:Normal     Neuro/Psych negative neurological ROS  negative psych ROS   GI/Hepatic negative GI ROS, Neg liver ROS,   Endo/Other  negative endocrine ROS  Renal/GU negative Renal ROS     Musculoskeletal   Abdominal Normal abdominal exam  (+)   Peds negative pediatric ROS (+)  Hematology negative hematology ROS (+)   Anesthesia Other Findings   Reproductive/Obstetrics                           Anesthesia Physical Anesthesia Plan  ASA: II  Anesthesia Plan: General   Post-op Pain Management:    Induction: Intravenous  PONV Risk Score and Plan: 2 and Ondansetron, Dexamethasone and Midazolam  Airway Management Planned: Video Laryngoscope Planned and Oral ETT  Additional Equipment: None  Intra-op Plan:   Post-operative Plan: Extubation in OR  Informed Consent: I have reviewed the patients History and Physical, chart, labs and discussed the procedure including the risks, benefits and alternatives for the proposed anesthesia with the patient or authorized representative who has indicated his/her understanding and acceptance.   Dental advisory given  Plan Discussed with: CRNA  Anesthesia Plan Comments: (Pt reports being told he had long QT and palpitations in high school. He was seen by cardiology, Dr. Jacinto HalimGanji, in 2018 for eval of EKG. Per Dr. Verl DickerGanji's note: "He is asymptomatic and his exam and EKG are completely normal today. It is difficult to say, but  what he was describing to me from when he was 19 sounds like PVC and early repolarization found on EKG, which would have been a normal variant at a young age. There is no evidence of EKG abnormality today and would not recommend any further testing or genetic testing.Marland Kitchen.Marland Kitchen.He can return on PRN basis.")    Anesthesia Quick Evaluation

## 2018-06-07 ENCOUNTER — Ambulatory Visit (HOSPITAL_COMMUNITY)
Admission: RE | Admit: 2018-06-07 | Discharge: 2018-06-07 | Disposition: A | Discharge status: Home / Self Care | Payer: BLUE CROSS/BLUE SHIELD | Source: Ambulatory Visit | Attending: Otolaryngology | Admitting: Otolaryngology

## 2018-06-07 ENCOUNTER — Other Ambulatory Visit: Payer: Self-pay

## 2018-06-07 ENCOUNTER — Ambulatory Visit (HOSPITAL_COMMUNITY): Payer: BLUE CROSS/BLUE SHIELD | Admitting: Anesthesiology

## 2018-06-07 ENCOUNTER — Ambulatory Visit (HOSPITAL_COMMUNITY): Payer: BLUE CROSS/BLUE SHIELD | Admitting: Physician Assistant

## 2018-06-07 ENCOUNTER — Encounter (HOSPITAL_COMMUNITY)
Admission: RE | Disposition: A | Discharge status: Home / Self Care | Payer: Self-pay | Source: Ambulatory Visit | Attending: Otolaryngology

## 2018-06-07 ENCOUNTER — Encounter (HOSPITAL_COMMUNITY): Payer: Self-pay

## 2018-06-07 DIAGNOSIS — G4733 Obstructive sleep apnea (adult) (pediatric): Secondary | ICD-10-CM | POA: Diagnosis not present

## 2018-06-07 DIAGNOSIS — J322 Chronic ethmoidal sinusitis: Secondary | ICD-10-CM | POA: Insufficient documentation

## 2018-06-07 DIAGNOSIS — I4581 Long QT syndrome: Secondary | ICD-10-CM | POA: Insufficient documentation

## 2018-06-07 DIAGNOSIS — G2581 Restless legs syndrome: Secondary | ICD-10-CM | POA: Insufficient documentation

## 2018-06-07 DIAGNOSIS — Z79899 Other long term (current) drug therapy: Secondary | ICD-10-CM | POA: Insufficient documentation

## 2018-06-07 DIAGNOSIS — J0120 Acute ethmoidal sinusitis, unspecified: Secondary | ICD-10-CM | POA: Diagnosis not present

## 2018-06-07 DIAGNOSIS — F17210 Nicotine dependence, cigarettes, uncomplicated: Secondary | ICD-10-CM | POA: Insufficient documentation

## 2018-06-07 DIAGNOSIS — R0683 Snoring: Secondary | ICD-10-CM | POA: Insufficient documentation

## 2018-06-07 DIAGNOSIS — K219 Gastro-esophageal reflux disease without esophagitis: Secondary | ICD-10-CM | POA: Insufficient documentation

## 2018-06-07 DIAGNOSIS — J320 Chronic maxillary sinusitis: Secondary | ICD-10-CM | POA: Diagnosis not present

## 2018-06-07 HISTORY — PX: MAXILLARY ANTROSTOMY: SHX2003

## 2018-06-07 HISTORY — PX: ETHMOIDECTOMY: SHX5197

## 2018-06-07 HISTORY — DX: Chronic sinusitis, unspecified: J32.9

## 2018-06-07 SURGERY — ETHMOIDECTOMY
Anesthesia: General | Laterality: Right

## 2018-06-07 MED ORDER — HYDROCODONE-ACETAMINOPHEN 7.5-325 MG PO TABS: 1.0000 | ORAL_TABLET | Freq: Four times a day (QID) | ORAL | 0 refills | Status: DC | PRN

## 2018-06-07 MED ORDER — DEXAMETHASONE SODIUM PHOSPHATE 10 MG/ML IJ SOLN
INTRAMUSCULAR | Status: AC
  Filled 2018-06-07: qty 1

## 2018-06-07 MED ORDER — HYDROMORPHONE HCL 1 MG/ML IJ SOLN
INTRAMUSCULAR | Status: AC
  Administered 2018-06-07: 0.5 mg via INTRAVENOUS
  Filled 2018-06-07: qty 1

## 2018-06-07 MED ORDER — LIDOCAINE 2% (20 MG/ML) 5 ML SYRINGE
INTRAMUSCULAR | Status: DC | PRN
  Administered 2018-06-07: 60 mg via INTRAVENOUS

## 2018-06-07 MED ORDER — ROCURONIUM BROMIDE 50 MG/5ML IV SOSY
PREFILLED_SYRINGE | INTRAVENOUS | Status: AC
  Filled 2018-06-07: qty 5

## 2018-06-07 MED ORDER — HYDROMORPHONE HCL 1 MG/ML IJ SOLN
0.2500 mg | INTRAMUSCULAR | Status: DC | PRN
  Administered 2018-06-07 (×2): 0.5 mg via INTRAVENOUS

## 2018-06-07 MED ORDER — SUCCINYLCHOLINE CHLORIDE 200 MG/10ML IV SOSY
PREFILLED_SYRINGE | INTRAVENOUS | Status: DC | PRN
  Administered 2018-06-07: 140 mg via INTRAVENOUS

## 2018-06-07 MED ORDER — ONDANSETRON HCL 4 MG/2ML IJ SOLN
INTRAMUSCULAR | Status: AC
  Filled 2018-06-07: qty 2

## 2018-06-07 MED ORDER — LIDOCAINE-EPINEPHRINE 1 %-1:100000 IJ SOLN
INTRAMUSCULAR | Status: AC
  Filled 2018-06-07: qty 1

## 2018-06-07 MED ORDER — LIDOCAINE-EPINEPHRINE 1 %-1:100000 IJ SOLN
INTRAMUSCULAR | Status: DC | PRN
  Administered 2018-06-07: 5 mL

## 2018-06-07 MED ORDER — LIDOCAINE 2% (20 MG/ML) 5 ML SYRINGE
INTRAMUSCULAR | Status: AC
  Filled 2018-06-07: qty 5

## 2018-06-07 MED ORDER — BACITRACIN ZINC 500 UNIT/GM EX OINT
TOPICAL_OINTMENT | CUTANEOUS | Status: AC
  Filled 2018-06-07: qty 28.35

## 2018-06-07 MED ORDER — LACTATED RINGERS IV SOLN
INTRAVENOUS | Status: DC
  Administered 2018-06-07: 09:00:00 via INTRAVENOUS

## 2018-06-07 MED ORDER — PROPOFOL 10 MG/ML IV BOLUS
INTRAVENOUS | Status: AC
  Filled 2018-06-07: qty 20

## 2018-06-07 MED ORDER — LACTATED RINGERS IV SOLN: INTRAVENOUS | Status: DC

## 2018-06-07 MED ORDER — FENTANYL CITRATE (PF) 100 MCG/2ML IJ SOLN
INTRAMUSCULAR | Status: AC
  Filled 2018-06-07: qty 2

## 2018-06-07 MED ORDER — ARTIFICIAL TEARS OPHTHALMIC OINT
TOPICAL_OINTMENT | OPHTHALMIC | Status: DC | PRN
  Administered 2018-06-07: 1 via OPHTHALMIC

## 2018-06-07 MED ORDER — ESMOLOL HCL 100 MG/10ML IV SOLN
INTRAVENOUS | Status: AC
  Filled 2018-06-07: qty 10

## 2018-06-07 MED ORDER — ONDANSETRON HCL 4 MG/2ML IJ SOLN
INTRAMUSCULAR | Status: DC | PRN
  Administered 2018-06-07: 4 mg via INTRAVENOUS

## 2018-06-07 MED ORDER — PROPOFOL 10 MG/ML IV BOLUS
INTRAVENOUS | Status: DC | PRN
  Administered 2018-06-07: 40 mg via INTRAVENOUS
  Administered 2018-06-07: 200 mg via INTRAVENOUS

## 2018-06-07 MED ORDER — MEPERIDINE HCL 50 MG/ML IJ SOLN: 6.2500 mg | INTRAMUSCULAR | Status: DC | PRN

## 2018-06-07 MED ORDER — OXYMETAZOLINE HCL 0.05 % NA SOLN
NASAL | Status: DC | PRN
  Administered 2018-06-07: 1 via TOPICAL

## 2018-06-07 MED ORDER — SODIUM CHLORIDE 0.9 % IR SOLN
Status: DC | PRN
  Administered 2018-06-07: 200 mL

## 2018-06-07 MED ORDER — PROMETHAZINE HCL 25 MG/ML IJ SOLN
6.2500 mg | INTRAMUSCULAR | Status: DC | PRN
  Administered 2018-06-07: 12.5 mg via INTRAVENOUS

## 2018-06-07 MED ORDER — OXYMETAZOLINE HCL 0.05 % NA SOLN
NASAL | Status: AC
  Filled 2018-06-07: qty 15

## 2018-06-07 MED ORDER — DEXAMETHASONE SODIUM PHOSPHATE 10 MG/ML IJ SOLN
INTRAMUSCULAR | Status: DC | PRN
  Administered 2018-06-07: 10 mg via INTRAVENOUS

## 2018-06-07 MED ORDER — ESMOLOL HCL 100 MG/10ML IV SOLN
INTRAVENOUS | Status: DC | PRN
  Administered 2018-06-07: 30 mg via INTRAVENOUS

## 2018-06-07 MED ORDER — OXYMETAZOLINE HCL 0.05 % NA SOLN
NASAL | Status: AC
  Administered 2018-06-07: 2 via NASAL
  Filled 2018-06-07: qty 15

## 2018-06-07 MED ORDER — PROMETHAZINE HCL 25 MG RE SUPP: 25.0000 mg | Freq: Four times a day (QID) | RECTAL | 1 refills | Status: DC | PRN

## 2018-06-07 MED ORDER — PROMETHAZINE HCL 25 MG/ML IJ SOLN
INTRAMUSCULAR | Status: AC
  Filled 2018-06-07: qty 1

## 2018-06-07 MED ORDER — MIDAZOLAM HCL 5 MG/5ML IJ SOLN
INTRAMUSCULAR | Status: DC | PRN
  Administered 2018-06-07: 2 mg via INTRAVENOUS

## 2018-06-07 MED ORDER — PHENYLEPHRINE 40 MCG/ML (10ML) SYRINGE FOR IV PUSH (FOR BLOOD PRESSURE SUPPORT)
PREFILLED_SYRINGE | INTRAVENOUS | Status: AC
  Filled 2018-06-07: qty 10

## 2018-06-07 MED ORDER — OXYMETAZOLINE HCL 0.05 % NA SOLN
2.0000 | NASAL | Status: DC
  Administered 2018-06-07 (×2): 2 via NASAL

## 2018-06-07 MED ORDER — ARTIFICIAL TEARS OPHTHALMIC OINT
TOPICAL_OINTMENT | OPHTHALMIC | Status: AC
  Filled 2018-06-07: qty 3.5

## 2018-06-07 MED ORDER — MIDAZOLAM HCL 2 MG/2ML IJ SOLN
INTRAMUSCULAR | Status: AC
  Filled 2018-06-07: qty 2

## 2018-06-07 MED ORDER — FENTANYL CITRATE (PF) 250 MCG/5ML IJ SOLN
INTRAMUSCULAR | Status: DC | PRN
  Administered 2018-06-07: 100 ug via INTRAVENOUS

## 2018-06-07 SURGICAL SUPPLY — 32 items
ATTRACTOMAT 16X20 MAGNETIC DRP (DRAPES) IMPLANT
BLADE RAD40 ROTATE 4M 4 5PK (BLADE) IMPLANT
BLADE RAD60 ROTATE M4 4 5PK (BLADE) IMPLANT
BLADE SURG 15 STRL LF DISP TIS (BLADE) IMPLANT
BLADE SURG 15 STRL SS (BLADE)
BLADE TRICUT ROTATE M4 4 5PK (BLADE) IMPLANT
CANISTER SUCT 3000ML PPV (MISCELLANEOUS) ×4 IMPLANT
COVER WAND RF STERILE (DRAPES) IMPLANT
DRAPE HALF SHEET 40X57 (DRAPES) IMPLANT
DRESSING NASAL KENNEDY 3.5X.9 (MISCELLANEOUS) IMPLANT
DRSG NASAL KENNEDY 3.5X.9 (MISCELLANEOUS)
DRSG NASOPORE 8CM (GAUZE/BANDAGES/DRESSINGS) ×2 IMPLANT
ELECT REM PT RETURN 9FT ADLT (ELECTROSURGICAL)
ELECTRODE REM PT RTRN 9FT ADLT (ELECTROSURGICAL) IMPLANT
GLOVE ECLIPSE 7.5 STRL STRAW (GLOVE) ×4 IMPLANT
GOWN STRL REUS W/ TWL LRG LVL3 (GOWN DISPOSABLE) ×3 IMPLANT
GOWN STRL REUS W/TWL LRG LVL3 (GOWN DISPOSABLE) ×3
KIT BASIN OR (CUSTOM PROCEDURE TRAY) ×2 IMPLANT
KIT TURNOVER KIT B (KITS) ×2 IMPLANT
NEEDLE PRECISIONGLIDE 27X1.5 (NEEDLE) ×2 IMPLANT
NS IRRIG 1000ML POUR BTL (IV SOLUTION) ×2 IMPLANT
PAD ARMBOARD 7.5X6 YLW CONV (MISCELLANEOUS) ×4 IMPLANT
PATTIES SURGICAL .5 X3 (DISPOSABLE) ×2 IMPLANT
SHEATH ENDOSCRUB 0 DEG (SHEATH) IMPLANT
SHEATH ENDOSCRUB 30 DEG (SHEATH) IMPLANT
SPECIMEN JAR SMALL (MISCELLANEOUS) ×2 IMPLANT
SWAB COLLECTION DEVICE MRSA (MISCELLANEOUS) IMPLANT
SWAB CULTURE ESWAB REG 1ML (MISCELLANEOUS) IMPLANT
SYR 50ML SLIP (SYRINGE) IMPLANT
TOWEL OR 17X24 6PK STRL BLUE (TOWEL DISPOSABLE) ×2 IMPLANT
TRAY ENT MC OR (CUSTOM PROCEDURE TRAY) ×2 IMPLANT
WATER STERILE IRR 1000ML POUR (IV SOLUTION) ×2 IMPLANT

## 2018-06-07 NOTE — Discharge Instructions (Signed)
Use nasal saline spray every 30 minutes - 1 hour while awake 2 weeks.  Avoid any bending over, heavy lifting, nose blowing, or any strenuous activity.

## 2018-06-07 NOTE — Transfer of Care (Signed)
Immediate Anesthesia Transfer of Care Note  Patient: Danny Marsh  Procedure(s) Performed: ETHMOIDECTOMY (Right ) MAXILLARY ANTROSTOMY (Right )  Patient Location: PACU  Anesthesia Type:General  Level of Consciousness: awake, drowsy and patient cooperative  Airway & Oxygen Therapy: Patient Spontanous Breathing and Patient connected to face mask oxygen  Post-op Assessment: Report given to RN and Post -op Vital signs reviewed and stable  Post vital signs: Reviewed and stable  Last Vitals:  Vitals Value Taken Time  BP 158/104 06/07/2018 10:56 AM  Temp 36.5 C 06/07/2018 10:56 AM  Pulse 74 06/07/2018 10:56 AM  Resp 10 06/07/2018 10:56 AM  SpO2 100 % 06/07/2018 10:56 AM  Vitals shown include unvalidated device data.  Last Pain:  Vitals:   06/07/18 1056  TempSrc:   PainSc: 0-No pain      Patients Stated Pain Goal: 3 (06/07/18 0902)  Complications: No apparent anesthesia complications

## 2018-06-07 NOTE — Anesthesia Procedure Notes (Signed)
Procedure Name: Intubation Date/Time: 06/07/2018 10:04 AM Performed by: Adria Dillonkin, Billyjoe Go A, CRNA Pre-anesthesia Checklist: Patient identified, Emergency Drugs available, Suction available and Patient being monitored Patient Re-evaluated:Patient Re-evaluated prior to induction Oxygen Delivery Method: Circle system utilized Preoxygenation: Pre-oxygenation with 100% oxygen Induction Type: IV induction Ventilation: Mask ventilation without difficulty Laryngoscope Size: Glidescope and 4 Grade View: Grade I Tube type: Oral Tube size: 7.5 mm Number of attempts: 2 Airway Equipment and Method: Rigid stylet Placement Confirmation: ETT inserted through vocal cords under direct vision,  positive ETCO2 and breath sounds checked- equal and bilateral Secured at: 23 cm Dental Injury: Teeth and Oropharynx as per pre-operative assessment  Difficulty Due To: Difficulty was anticipated, Difficult Airway- due to large tongue and Difficult Airway- due to anterior larynx Future Recommendations: Recommend- induction with short-acting agent, and alternative techniques readily available

## 2018-06-07 NOTE — Op Note (Signed)
OPERATIVE REPORT  DATE OF SURGERY: 06/07/2018  PATIENT:  Danny Marsh,  42 y.o. male  PRE-OPERATIVE DIAGNOSIS:  Chronic ethmoid and maxillary sinusitus  POST-OPERATIVE DIAGNOSIS:  Chronic ethmoid and maxillary sinusitus  PROCEDURE:  Procedure(s): Right ETHMOIDECTOMY Right MAXILLARY ANTROSTOMY  SURGEON:  Susy FrizzleJefry H Keishia Ground, MD  ASSISTANTS: None  ANESTHESIA:   General   EBL: 50 ml  DRAINS: None  LOCAL MEDICATIONS USED: 1% Xylocaine with epinephrine  SPECIMEN: Right nasal and sinus contents  COUNTS:  Correct  PROCEDURE DETAILS: The patient was taken to the operating room and placed on the operating table in the supine position. Following induction of general endotracheal anesthesia, the face was draped in standard fashion.  Afrin spray was used preoperatively in the nasal cavities.  0 degree endoscope was used to inspect the right nasal cavity.  There was edematous polypoid tissue within the right infundibulum.  There was some purulent secretions.  The right middle turbinate was infiltrated with local anesthetic solution in the superior and posterior attachments.  1.  Right endoscopic anterior ethmoidectomy.  Using a combination of 0 and 30 degree nasal endoscope to the right ethmoidectomy was performed.  The uncinate process was removed using a sickle knife.  The bulla was exposed and was exonerated using the microdebrider.  The dissection then continued up towards the fovea ethmoidalis which was kept intact, laterally to the lamina papyracea which was kept intact, and immediately to the middle turbinate which was also kept intact.  Anterior cells were filled with polypoid disease and the more posterior anterior cells were clear.  The posterior cells themselves were not dissected.  Frontal recess was cleaned of polypoid disease as well.  At the termination of the procedure the ethmoid cavity was packed with about two thirds of a NasalPore dressing.  2.  Right endoscopic maxillary  antrostomy with removal of tissue.  After the bulla was opened the fontanelle was inspected and a curved suction was used to enter into the right maxillary sinus.  The sinus was filled with polypoid disease.  Antrostomy was enlarged anteriorly using backbiting forceps and inferiorly using Wilde-Blakesley forceps.  A wide antrostomy was created and polypoid tissue was weaned out of the maxillary sinus using combination of microdebrider and suction.  After the completion of the dissection the maxillary antrum was packed with the remaining one third of a NasalPore dressing.  Nasal cavity was suctioned of blood and secretions.  Pharynx was also suctioned.  Patient was awakened extubated and transferred to recovery in stable condition.    PATIENT DISPOSITION:  To PACU, stable

## 2018-06-07 NOTE — Interval H&P Note (Signed)
History and Physical Interval Note:  06/07/2018 9:34 AM  Danny Marsh  has presented today for surgery, with the diagnosis of Chronic maxillary sinusitus  The various methods of treatment have been discussed with the patient and family. After consideration of risks, benefits and other options for treatment, the patient has consented to  Procedure(s): ETHMOIDECTOMY (Right) MAXILLARY ANTROSTOMY (Right) as a surgical intervention .  The patient's history has been reviewed, patient examined, no change in status, stable for surgery.  I have reviewed the patient's chart and labs.  Questions were answered to the patient's satisfaction.     Serena ColonelJefry Maika Kaczmarek

## 2018-06-07 NOTE — Anesthesia Postprocedure Evaluation (Signed)
Anesthesia Post Note  Patient: Dolores Loryrrol Hengel  Procedure(s) Performed: ETHMOIDECTOMY (Right ) MAXILLARY ANTROSTOMY (Right )     Patient location during evaluation: PACU Anesthesia Type: General Level of consciousness: awake and alert Pain management: pain level controlled Vital Signs Assessment: post-procedure vital signs reviewed and stable Respiratory status: spontaneous breathing, nonlabored ventilation, respiratory function stable and patient connected to nasal cannula oxygen Cardiovascular status: blood pressure returned to baseline and stable Postop Assessment: no apparent nausea or vomiting Anesthetic complications: no Comments: N/V greatly improved.     Last Vitals:  Vitals:   06/07/18 1155 06/07/18 1201  BP:  (!) 159/98  Pulse:  69  Resp:  15  Temp: 36.5 C   SpO2:  96%    Last Pain:  Vitals:   06/07/18 1201  TempSrc:   PainSc: 0-No pain                 Shelton SilvasKevin D Hollis

## 2018-06-08 ENCOUNTER — Encounter (HOSPITAL_COMMUNITY): Payer: Self-pay | Admitting: Otolaryngology

## 2018-06-09 ENCOUNTER — Encounter: Payer: Self-pay | Admitting: Family Medicine

## 2018-07-31 ENCOUNTER — Ambulatory Visit (INDEPENDENT_AMBULATORY_CARE_PROVIDER_SITE_OTHER): Payer: BLUE CROSS/BLUE SHIELD | Admitting: Family Medicine

## 2018-07-31 ENCOUNTER — Encounter: Payer: Self-pay | Admitting: Family Medicine

## 2018-07-31 VITALS — BP 114/73 | HR 86 | Temp 98.2°F | Resp 16 | Ht 71.5 in | Wt 218.2 lb

## 2018-07-31 DIAGNOSIS — R21 Rash and other nonspecific skin eruption: Secondary | ICD-10-CM | POA: Diagnosis not present

## 2018-07-31 MED ORDER — MUPIROCIN 2 % EX OINT: TOPICAL_OINTMENT | CUTANEOUS | 1 refills | Status: DC

## 2018-07-31 NOTE — Progress Notes (Signed)
OFFICE VISIT  07/31/2018   CC:  Chief Complaint  Patient presents with  . Rash    after shaving x 6 months    HPI:    Patient is a 43 y.o.  male who presents for rash. For about the last 8 months, after shaving over upper lip (electric OR manual razor) he gets some irritating redness of skin that over the next day or so would swell some and fissure.  If he applied any after shave it would spread up around sides of nose.  No itch--just an "irritated" feeling. He had to put off shaving q4d or so to give it time to return to normal.  Aquafor no help. He used oil of olay last night and it seems like the reaction was not as bad after shaving this morning. No pustules.  Changes razors-no help.  No recent abnl contact with anything that could cause irritation other than shaving. Uses shaving gel, but for the last 2 mo only uses electric shaver on upper lip.  Remainder of face w/out any rash ---even in the other places where he shaves.  Skin on remainder of body normal.  Past Medical History:  Diagnosis Date  . Abnormal EKG    Unclear hx: when pt approx 19 he had ? long QT.  Subsequent expert opinions supported dx of normal varian--no QT prolongation.  Eval by Dr. Jacinto Halim again 08/06/16 NORMAL--no long QT syndrome (ekg age 46 likely PVC w/ early repol--normal variant for age)  . Chronic sinusitis   . Gross hematuria 2017   Multiple episodes after vigorous exercise (myoglobinuria?).  Renal u/s 06/15/16 showed very small bilat kidney stones, nonobstructing.   . OSA (obstructive sleep apnea) 12/2015   Oral appliance works well  . 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  Referred to ENT and odontics.  ENT changed him to clinda and did sinus surgery 06/07/18 (path benign).    Past Surgical History:  Procedure Laterality Date  . ETHMOIDECTOMY Right 06/07/2018   Procedure:  ETHMOIDECTOMY;  Surgeon: Serena Colonel, MD;  Location: [facility] OR;  Service: ENT;  Laterality: Right;  . MAXILLARY ANTROSTOMY Right 06/07/2018   Procedure: MAXILLARY ANTROSTOMY;  Surgeon: Serena Colonel, MD;  Location: [facility] OR;  Service: ENT;  Laterality: Right;  . PILONIDAL CYST DRAINAGE     x 2  . WISDOM TOOTH EXTRACTION      Outpatient Medications Prior to Visit  Medication Sig Dispense Refill  . acetaminophen (TYLENOL) 325 MG tablet Take 650 mg by mouth every 6 (six) hours as needed for moderate pain or headache.    . Ascorbic Acid (VITAMIN C PO) Take 1 tablet by mouth daily.    Marland Kitchen HYDROcodone-acetaminophen (NORCO) 7.5-325 MG tablet Take 1 tablet by mouth every 6 (six) hours as needed for moderate pain. (Patient not taking: Reported on 07/31/2018) 20 tablet 0  . promethazine (PHENERGAN) 25 MG suppository Place 1 suppository (25 mg total) rectally every 6 (six) hours as needed for nausea or vomiting. (Patient not taking: Reported on 07/31/2018) 12 suppository 1   No facility-administered medications prior to visit.     No Known Allergies  ROS As per HPI  PE: Blood pressure 114/73, pulse 86, temperature 98.2 F (36.8 C), temperature source Oral, resp. rate 16, height 5' 11.5" (1.816 m), weight 218 lb 4 oz (99 kg), SpO2 96 %. Gen: Alert, well appearing.  Patient  is oriented to person, place, time, and situation. AFFECT: pleasant, lucid thought and speech. Face: no rash.  He is clean shaven.  The upper lip has a couple of small, subtle splotches of erythema with a slight papules in the areas of redness.  No pustules, no fissuring, no swelling, no vesicales, no ulcers.  Lips normal.  Nasal passages with mildly injected mucosal lining but no mucous.  R nostril with slight edema due to relatively recent nasal sinus surgery (sinus polypectomy).  LABS:    Chemistry      Component Value Date/Time   NA 137 03/16/2018 0805   K 4.2 03/16/2018 0805   CL 102 03/16/2018 0805   CO2 27 03/16/2018 0805    BUN 17 03/16/2018 0805   CREATININE 1.02 03/16/2018 0805      Component Value Date/Time   CALCIUM 9.9 03/16/2018 0805   ALKPHOS 86 06/10/2016 0756   AST 22 06/10/2016 0756   ALT 26 06/10/2016 0756   BILITOT 0.9 06/10/2016 0756      IMPRESSION AND PLAN:  Facial rash that only occurs after shaving. Location limited to upper lip/philtrum only. Question atypical MRSA infection given its location just beneath nostrils. Other thing to consider is some sort of reactivation of HSV 1 in the area after shaving. Noninfectious dermatitis would make sense, but why would it occur just over the lip and not on other areas on face where he shaves? Will do trial of bactroban ointment: apply tid to affected areas and in each nostril until I see him again in 2 wks. No shaving for the next 4d.  An After Visit Summary was printed and given to the patient.  FOLLOW UP: Return in about 2 weeks (around 08/14/2018) for f/u rash.  Signed:  Santiago BumpersPhil Julee Stoll, MD           07/31/2018

## 2018-08-14 ENCOUNTER — Ambulatory Visit: Payer: BLUE CROSS/BLUE SHIELD | Admitting: Family Medicine

## 2018-08-15 DIAGNOSIS — L218 Other seborrheic dermatitis: Secondary | ICD-10-CM | POA: Diagnosis not present

## 2018-08-17 ENCOUNTER — Ambulatory Visit: Payer: BLUE CROSS/BLUE SHIELD | Admitting: Family Medicine

## 2019-01-05 ENCOUNTER — Ambulatory Visit: Payer: Self-pay | Admitting: *Deleted

## 2019-01-05 ENCOUNTER — Telehealth: Payer: Self-pay | Admitting: Family Medicine

## 2019-01-05 NOTE — Telephone Encounter (Signed)
Patient is calling because he discovered a tick on his back and he believed he pulled the head out. And the patient is wondering what else he should do? No swelling or irration. Any advice. Please advise. Thank you CB- [phone]    Returned call to patient regarding tick bite. No redness noted to the area, no fever or body aches. He removed the tick without any problems. He stated it was tiny.  Home care advised. Keep checking the site. Call back for fever, pain or body aches. Put a triple antibiotic on the area. Pt voiced understanding.  Reason for Disposition . Tick bite with no complications  Answer Assessment - Initial Assessment Questions 1. TYPE of TICK: "Is it a wood tick or a deer tick?" If unsure, ask: "What size was the tick?" "Did it look more like a watermelon seed or a poppy seed?"      Deer tick 2. LOCATION: "Where is the tick bite located?"      Lower back above waist line 3. ONSET: "How long do you think the tick was attached before you removed it?" (Hours or days)      Tuesday possibly 4. TETANUS: "When was the last tetanus booster?"      2017 5. PREGNANCY: "Is there any chance you are pregnant?" "When was your last menstrual period?"     no  Protocols used: TICK BITE-A-AH

## 2019-01-08 NOTE — Telephone Encounter (Signed)
Agree/noted. 

## 2019-02-26 DIAGNOSIS — L2089 Other atopic dermatitis: Secondary | ICD-10-CM | POA: Diagnosis not present

## 2019-04-06 ENCOUNTER — Other Ambulatory Visit: Payer: Self-pay

## 2019-04-06 DIAGNOSIS — Z20822 Contact with and (suspected) exposure to covid-19: Secondary | ICD-10-CM

## 2019-04-07 LAB — NOVEL CORONAVIRUS, NAA: SARS-CoV-2, NAA: NOT DETECTED

## 2019-04-23 ENCOUNTER — Other Ambulatory Visit: Payer: Self-pay

## 2019-04-23 ENCOUNTER — Ambulatory Visit (INDEPENDENT_AMBULATORY_CARE_PROVIDER_SITE_OTHER): Payer: BC Managed Care – PPO

## 2019-04-23 DIAGNOSIS — Z23 Encounter for immunization: Secondary | ICD-10-CM

## 2019-05-23 ENCOUNTER — Other Ambulatory Visit: Payer: Self-pay

## 2019-05-23 DIAGNOSIS — Z20822 Contact with and (suspected) exposure to covid-19: Secondary | ICD-10-CM

## 2019-05-25 LAB — NOVEL CORONAVIRUS, NAA: SARS-CoV-2, NAA: NOT DETECTED

## 2020-02-09 IMAGING — CR DG SINUSES COMPLETE 3+V
4 series · 4 of 4 positions shown · non-contrast
Comparison: None.

CLINICAL DATA: 42-year-old male with abnormal smell right nostril.
Chronic sinus infections. Initial encounter.

EXAM:
PARANASAL SINUSES - COMPLETE 3 + VIEW

[[person_name] *]
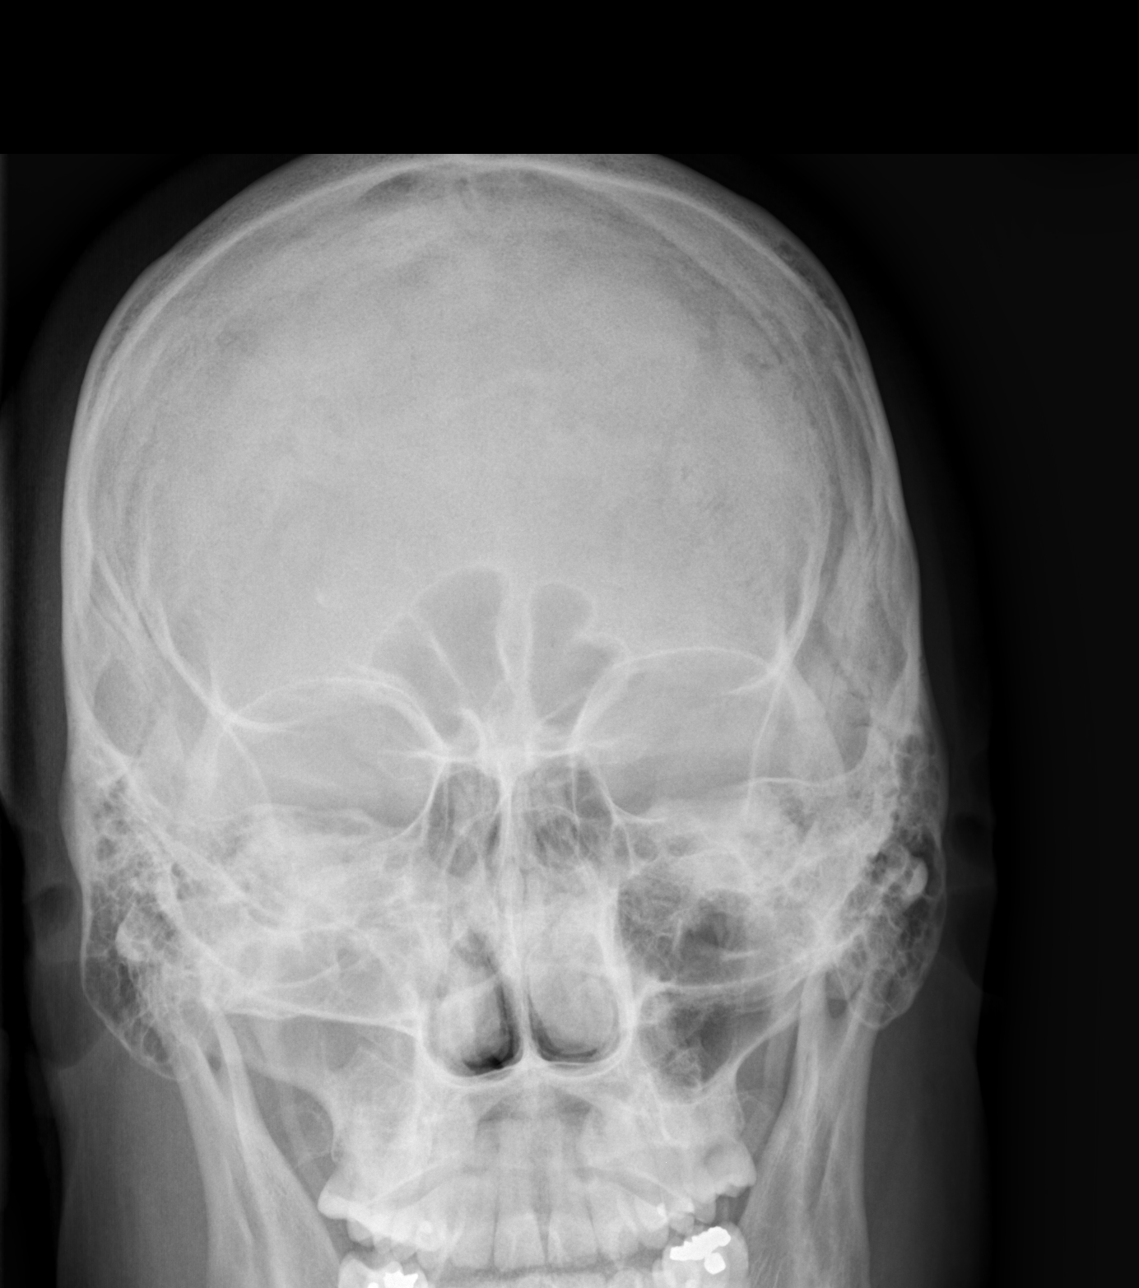

[w waters *]
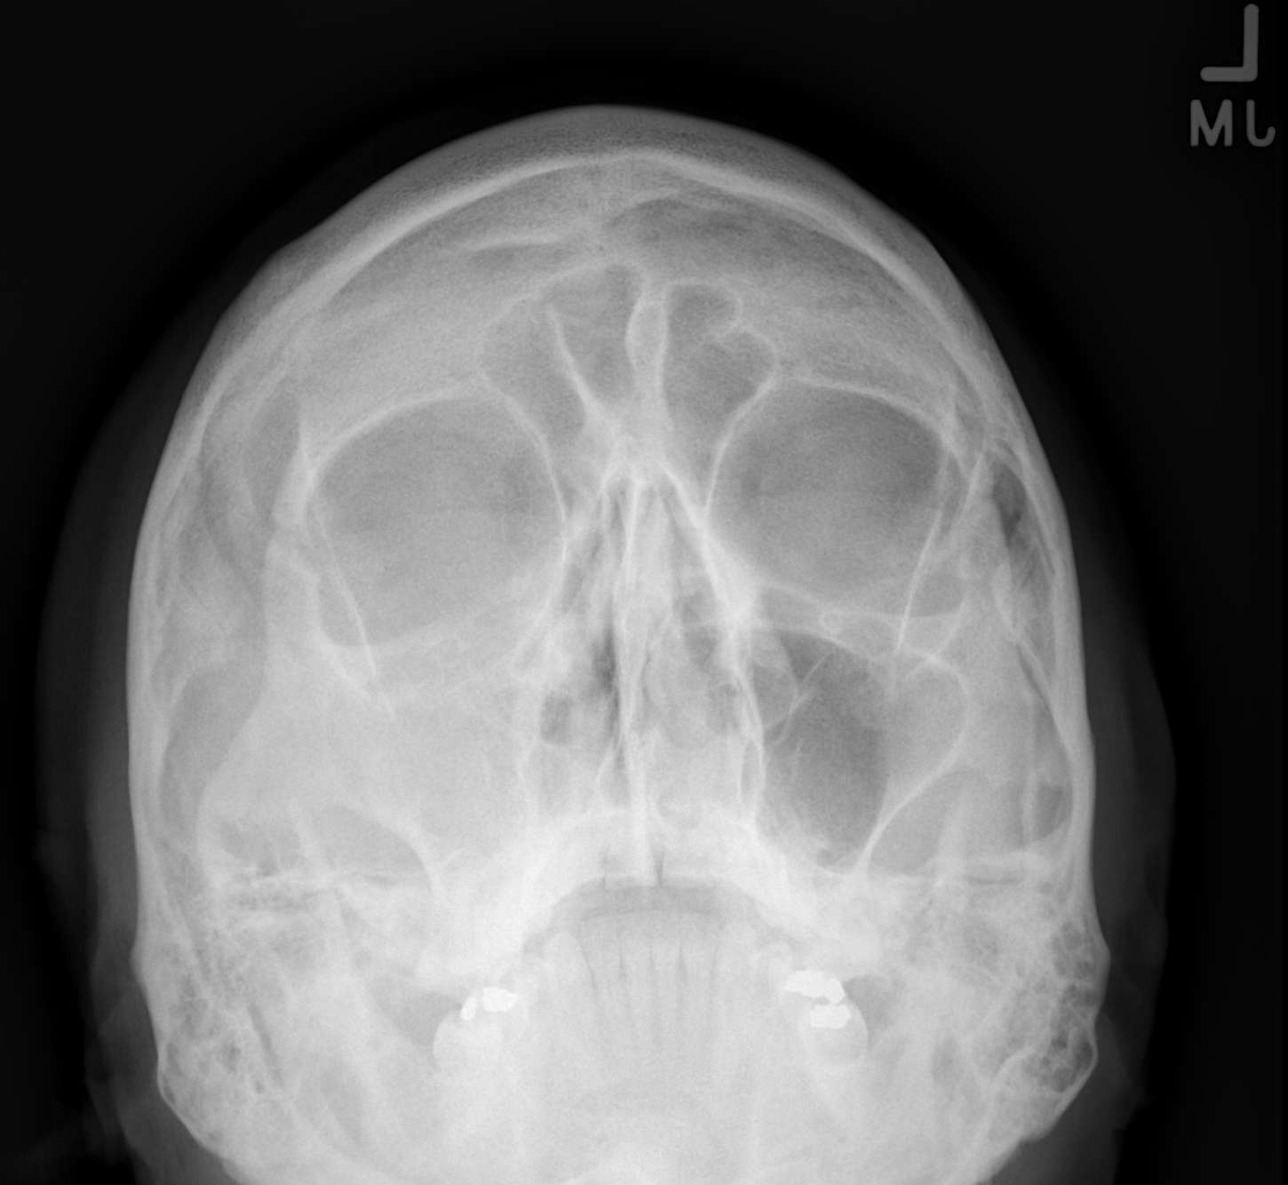

[w skull lat]
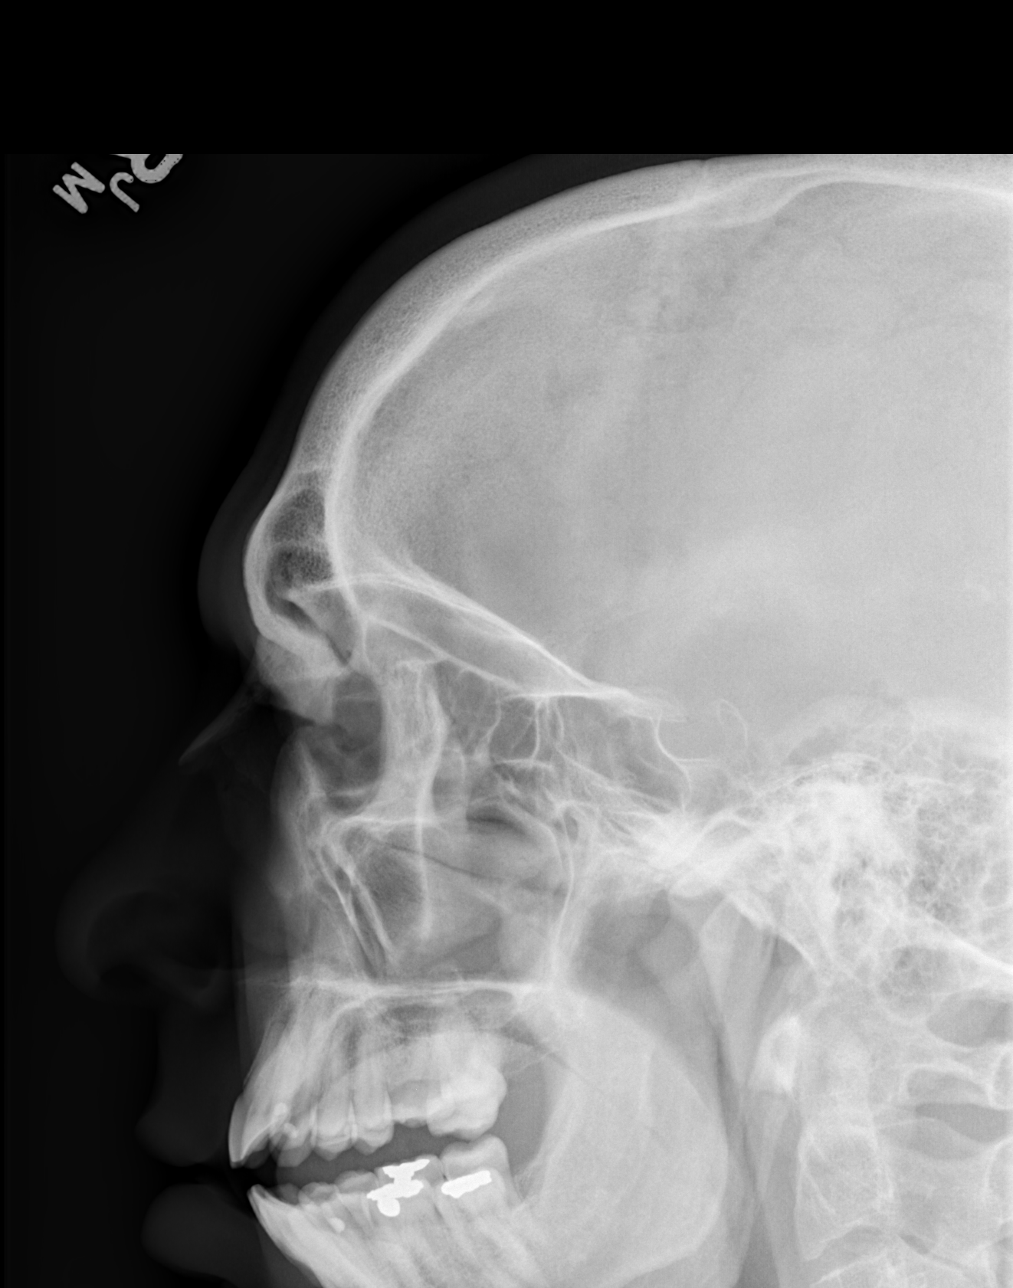

[w smv *]
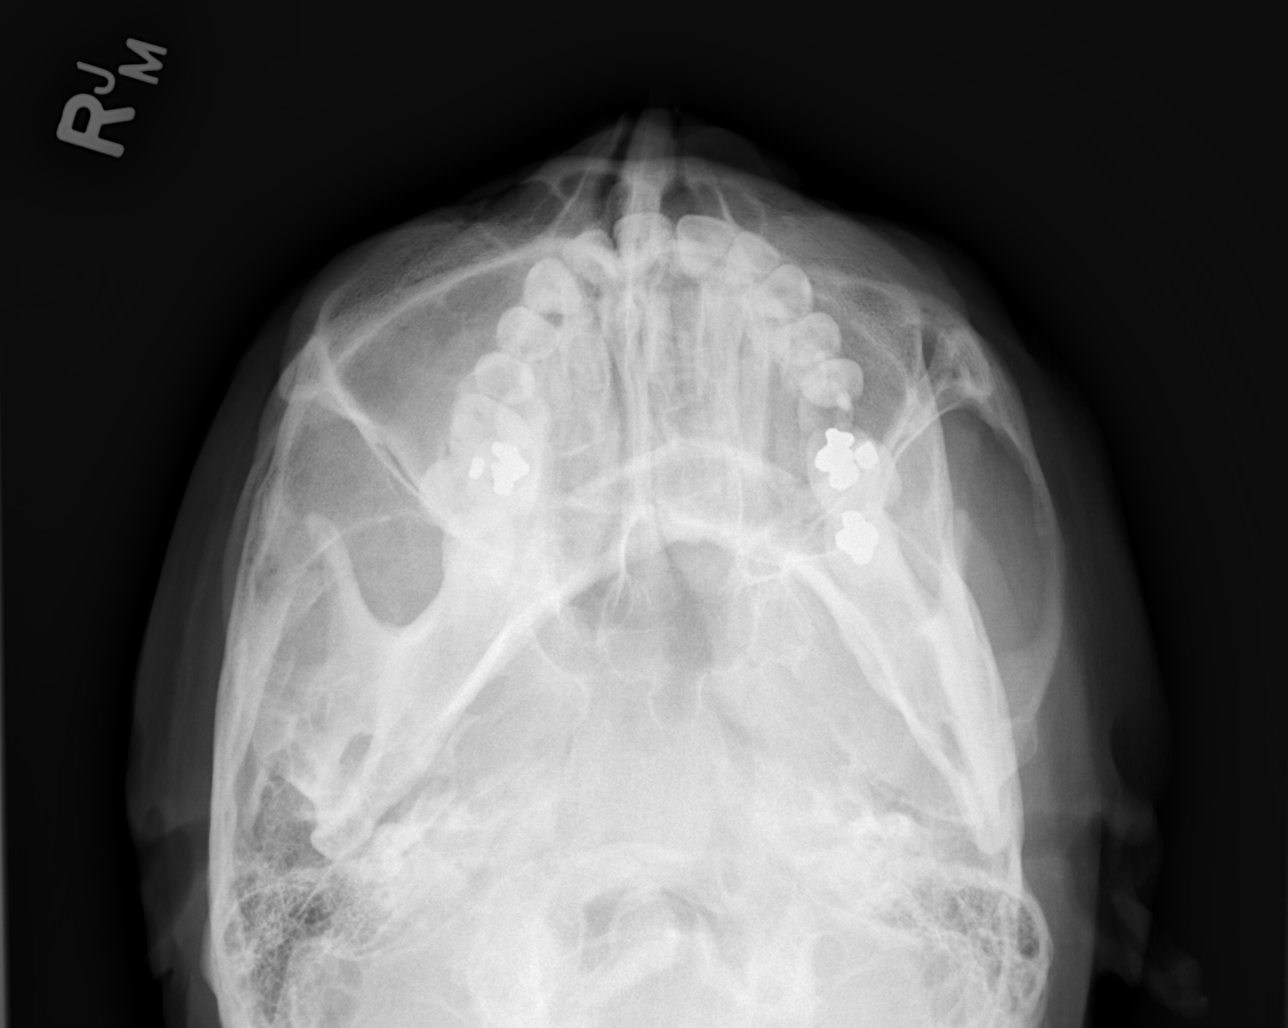

[4 of 4 positions shown; findings below may reference images not displayed]

FINDINGS: Prominent opacification right maxillary sinus. Remainder of
paranasal sinuses appear clear by plain film exam. If further
delineation is clinically desired, sinus CT may then be considered.
IMPRESSION: Opacified right maxillary sinus.  Please see above discussion.

## 2020-02-16 IMAGING — CT CT MAXILLOFACIAL W/ CM
4 of 14 series · 17 of 47 positions shown, 19 images · IV contrast (iopamidol)
Comparison: Radiography 03/13/2018

CLINICAL DATA: Right maxillary pain and swelling. Foul odor. One
year duration.

EXAM:
CT MAXILLOFACIAL WITH CONTRAST
TECHNIQUE: Multidetector CT images of the paranasal sinuses was performed
according to the standard protocol following intravenous contrast
administration.
CONTRAST:  75mL [id number] IOPAMIDOL ([id number]) INJECTION 61%

[Series 2: maxillofacial 2.00 hr60 s3 · axial · 0.31mm/px · z∈[-658,-534]mm · 7 of 84 slices shown, 9 images (1 of 2)]
[im 11/84  brain]
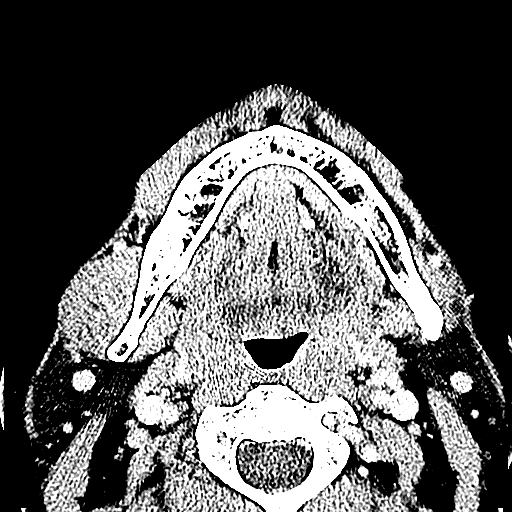
[im 11/84  bone]
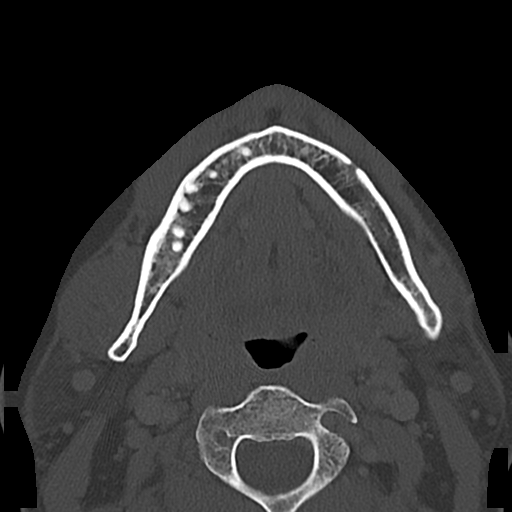
[im 21/84  bone]
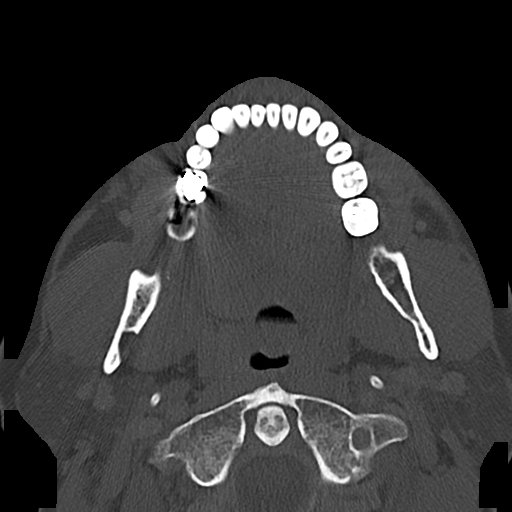
[im 32/84  bone]
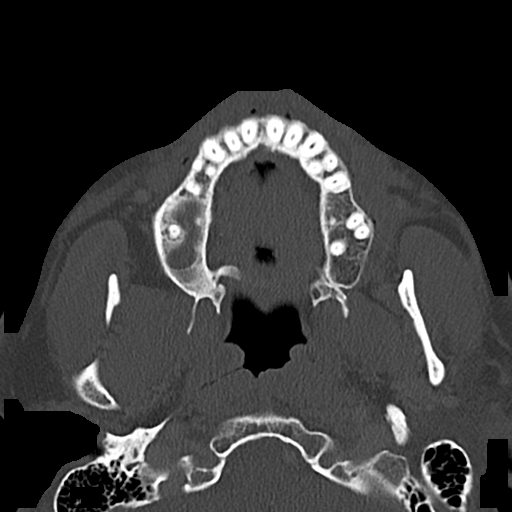
[im 42/84  bone]
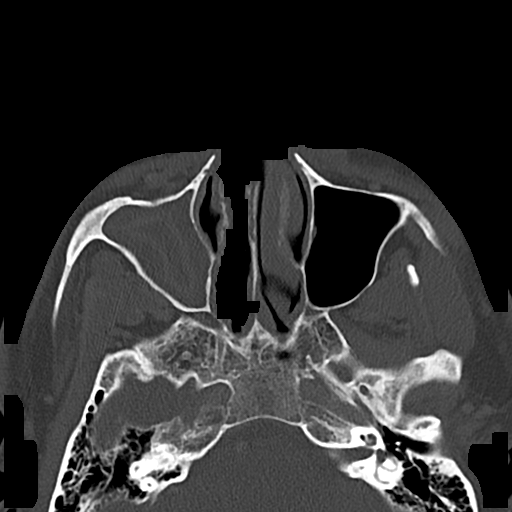
[im 52/84  brain]
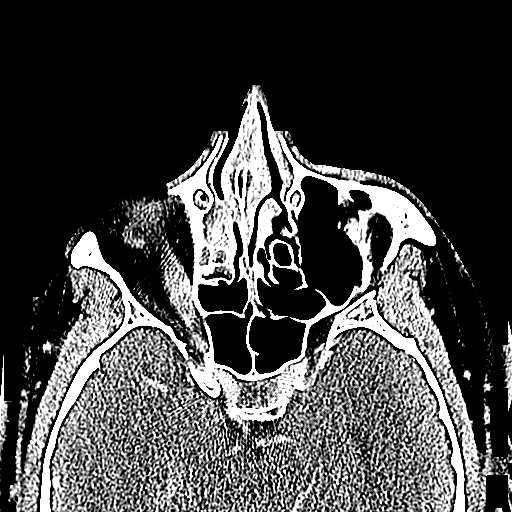
[im 52/84  bone]
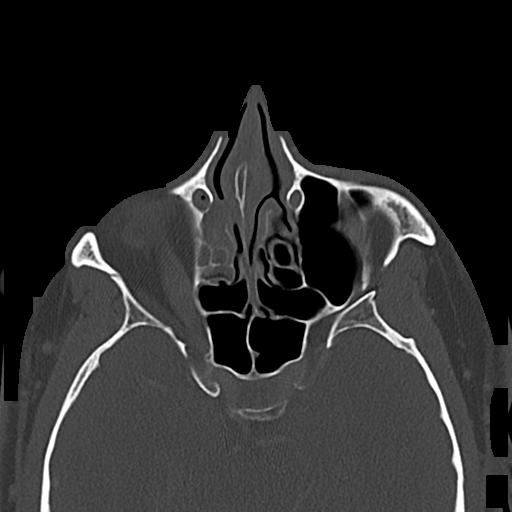
[im 63/84  bone]
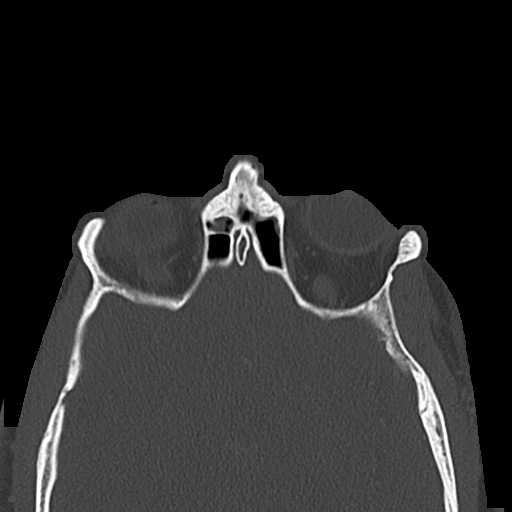
[im 73/84  bone]
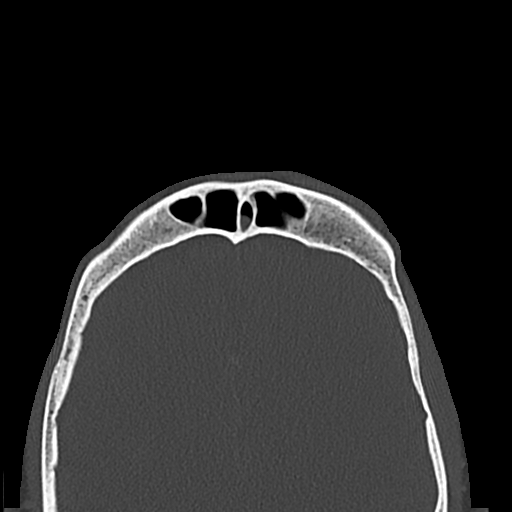

[Series 3: maxillofacial 2.00 hr40 s3 · axial · 0.31mm/px · z∈[-658,-534]mm · 7 of 84 slices shown]
[im 11/84  bone]
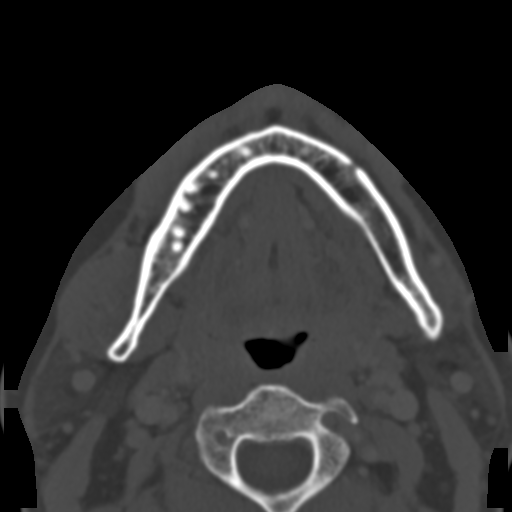
[im 21/84  bone]
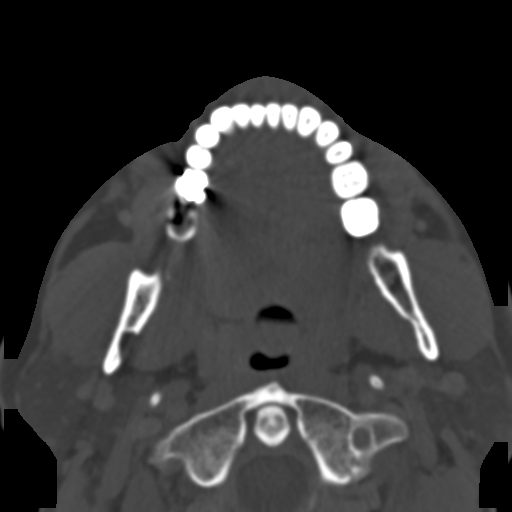
[im 32/84  bone]
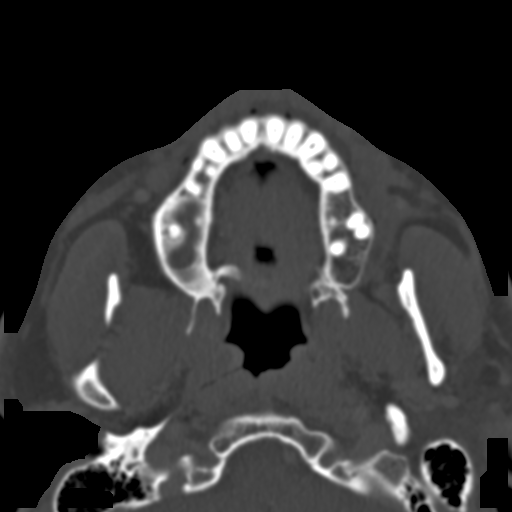
[im 42/84  bone]
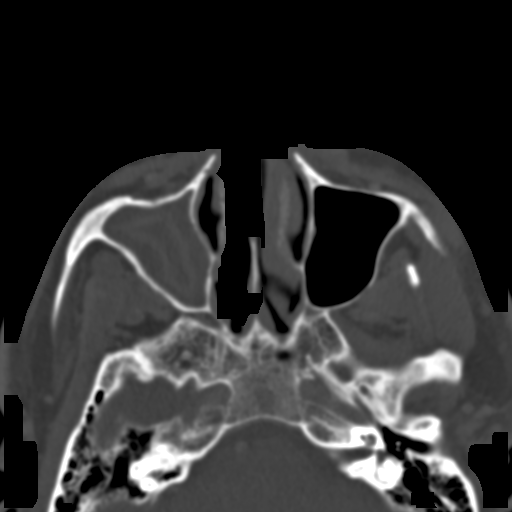
[im 52/84  bone]
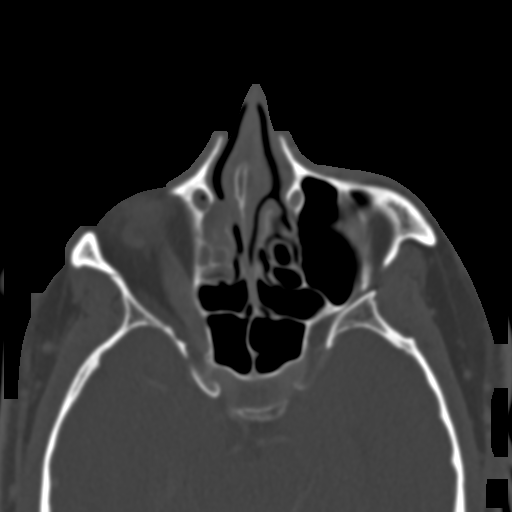
[im 63/84  bone]
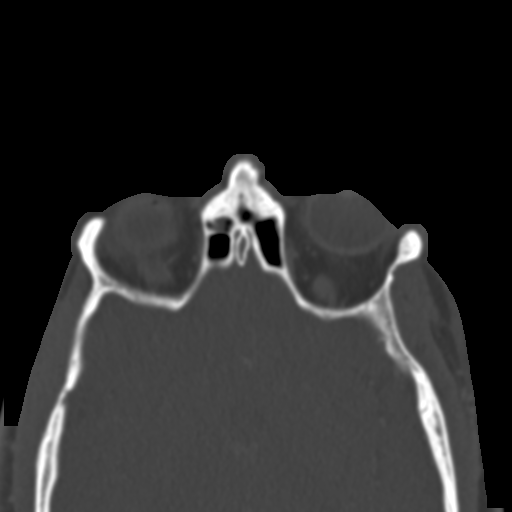
[im 73/84  bone]
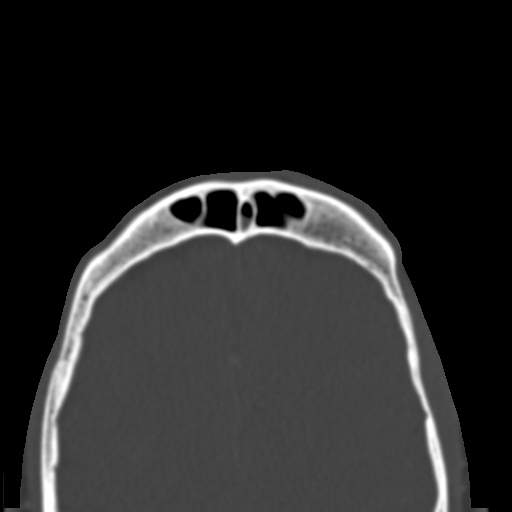

[Series 14: maxillofacial 2.00 hr60 s3 · axial · 0.28mm/px · z∈[-675,-655]mm · 2 of 49 slices shown (2 of 2)]
[im 10/49  bone]
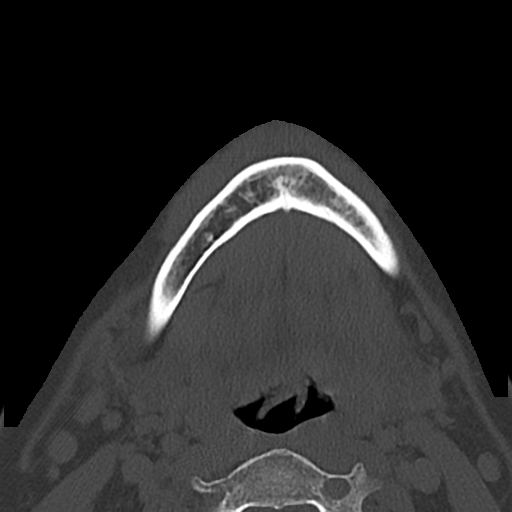
[im 20/49  bone]
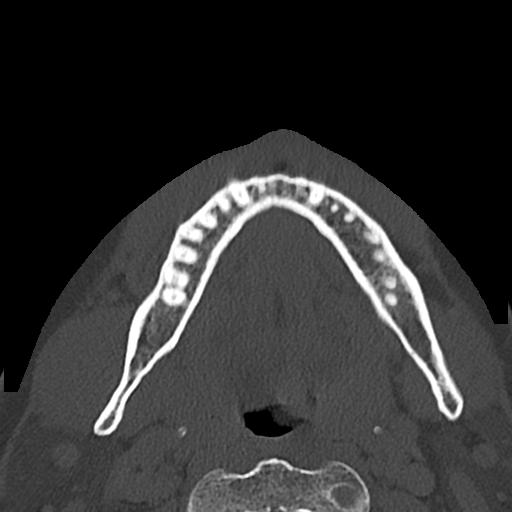

[Series 26: maxillofacial 2.00 hr60 s3 cor · coronal · 0.34mm/px · 1 of 80 slices shown]
[im 43/80  bone]
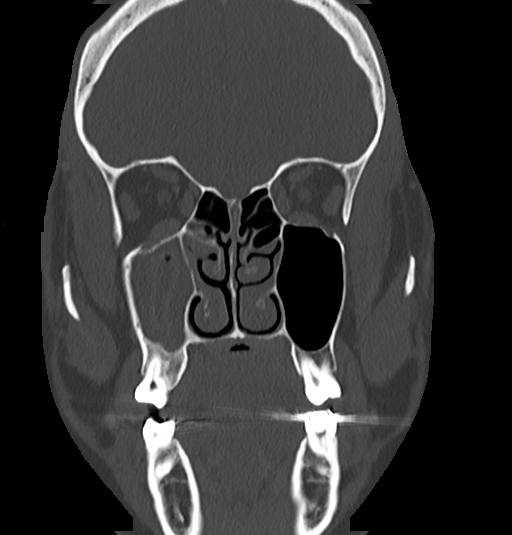

[17 of 47 positions shown; findings below may reference images not displayed]

FINDINGS: Frontal sinuses are clear. Left ethmoid sinuses are clear. Left
maxillary sinus is clear. Sphenoid sinus is clear. There is
opacification of the anterior ethmoid sinuses on the right. There is
complete opacification of the right maxillary sinus with mucosal
thickening. Findings are consistent with ostiomeatal unit
obstruction. The patient does have dental root incursion of the
right maxillary molar with a radicular cyst, likely responsible for
a great deal of the sinus inflammation.

Other soft tissues of the face appear unremarkable. Orbital
structures appear normal. Intracranial contents as seen appear
normal.
IMPRESSION: Complete opacification of the right maxillary sinus with
circumferential mucosal thickening. Opacification also of the
anterior ethmoid sinuses on the right. Findings consistent with
ostiomeatal unit obstruction. Dental root incursion from a right
maxillary molar, with radicular cyst likely the genesis of the sinus
inflammatory disease.

## 2020-03-25 ENCOUNTER — Ambulatory Visit (INDEPENDENT_AMBULATORY_CARE_PROVIDER_SITE_OTHER): Payer: BC Managed Care – PPO

## 2020-03-25 ENCOUNTER — Other Ambulatory Visit: Payer: Self-pay

## 2020-03-25 DIAGNOSIS — Z23 Encounter for immunization: Secondary | ICD-10-CM

## 2020-03-27 DIAGNOSIS — Z20822 Contact with and (suspected) exposure to covid-19: Secondary | ICD-10-CM | POA: Diagnosis not present

## 2020-05-08 ENCOUNTER — Other Ambulatory Visit: Payer: Self-pay

## 2020-05-08 ENCOUNTER — Ambulatory Visit (INDEPENDENT_AMBULATORY_CARE_PROVIDER_SITE_OTHER): Payer: BC Managed Care – PPO | Admitting: Family Medicine

## 2020-05-08 ENCOUNTER — Encounter: Payer: Self-pay | Admitting: Family Medicine

## 2020-05-08 VITALS — BP 111/73 | HR 84 | Temp 97.9°F | Resp 16 | Ht 71.5 in | Wt 221.2 lb

## 2020-05-08 DIAGNOSIS — E878 Other disorders of electrolyte and fluid balance, not elsewhere classified: Secondary | ICD-10-CM

## 2020-05-08 DIAGNOSIS — Z3009 Encounter for other general counseling and advice on contraception: Secondary | ICD-10-CM | POA: Diagnosis not present

## 2020-05-08 NOTE — Progress Notes (Signed)
OFFICE VISIT  05/08/2020  CC:  Chief Complaint  Patient presents with  . Discuss surgery    no surgery date as of yet   HPI:    Patient is a 44 y.o. male who presents for "discuss surgery".   Pt desires vasectomy but had a few questions about timing of referral, any alternatives for men, what the process entails, etc. He and his wife desire no further children but he understands even vasectomy is not 100 %, plus understands potential for reversal procedure if he desires.  "Motion sickness" x 10 yrs or so.  Slight off balance feeling, nausea, some sweating. Riding in a car, going on boats or airplanes usually probs, mild. Worsening lately, just things like sitting on swing.  Pacing around for too long at work, standing up too fast can bring on same issues now.  Dramamine has been helpful.  Seemingly minimal trigger at times.   Duration is sometimes and sometimes mild sx's for hours. No vertigo. No ataxia/imbalance when walking. In HS he says he had acute labyrinthitis.  No ringing in ears or hearing impairments. No rx meds.  ROS: no fevers, no CP, no SOB, no wheezing, no cough, no dizziness, no HAs, no rashes, no melena/hematochezia.  No polyuria or polydipsia.  No myalgias or arthralgias.  No focal weakness, paresthesias, or tremors.  No acute vision or hearing abnormalities. No n/v/d or abd pain.  No palpitations.     Past Medical History:  Diagnosis Date  . Abnormal EKG    Unclear hx: when pt approx 19 he had ? long QT.  Subsequent expert opinions supported dx of normal varian--no QT prolongation.  Eval by Dr. Jacinto Halim again 08/06/16 NORMAL--no long QT syndrome (ekg age 50 likely PVC w/ early repol--normal variant for age)  . Chronic sinusitis   . Gross hematuria 2017   Multiple episodes after vigorous exercise (myoglobinuria?).  Renal u/s 06/15/16 showed very small bilat kidney stones, nonobstructing.   . OSA (obstructive sleep apnea) 12/2015   Oral appliance works well  .  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  Referred to ENT and odontics.  ENT changed him to clinda and did sinus surgery 06/07/18 (path benign).    Past Surgical History:  Procedure Laterality Date  . ETHMOIDECTOMY Right 06/07/2018   Procedure: ETHMOIDECTOMY;  Surgeon: Serena Colonel, MD;  Location: [facility] OR;  Service: ENT;  Laterality: Right;  . MAXILLARY ANTROSTOMY Right 06/07/2018   Procedure: MAXILLARY ANTROSTOMY;  Surgeon: Serena Colonel, MD;  Location: [facility] OR;  Service: ENT;  Laterality: Right;  . PILONIDAL CYST DRAINAGE     x 2  . WISDOM TOOTH EXTRACTION      Outpatient Medications Prior to Visit  Medication Sig Dispense Refill  . acetaminophen (TYLENOL) 325 MG tablet Take 650 mg by mouth every 6 (six) hours as needed for moderate pain or headache. (Patient not taking: Reported on 05/08/2020)    . Ascorbic Acid (VITAMIN C PO) Take 1 tablet by mouth daily. (Patient not taking: Reported on 05/08/2020)    . mupirocin ointment (BACTROBAN) 2 % Apply to affected areas tid x 10d (Patient not taking: Reported on 05/08/2020) 15 g 1   No facility-administered medications prior to visit.    No Known Allergies  ROS As per HPI  PE: Vitals with BMI 05/08/2020 07/31/2018 06/07/2018  Height 5' 11.5" 5' 11.5" -  Weight  221 lbs 3 oz 218 lbs 4 oz -  BMI 30.42 30.02 -  Systolic 111 114 081  Diastolic 73 73 98  Pulse 84 86 69     Gen: Alert, well appearing.  Patient is oriented to person, place, time, and situation. AFFECT: pleasant, lucid thought and speech. No further exam today.  LABS:    Chemistry      Component Value Date/Time   NA 137 03/16/2018 0805   K 4.2 03/16/2018 0805   CL 102 03/16/2018 0805   CO2 27 03/16/2018 0805   BUN 17 03/16/2018 0805   CREATININE 1.02 03/16/2018 0805      Component Value Date/Time   CALCIUM 9.9 03/16/2018 0805   ALKPHOS 86 06/10/2016  0756   AST 22 06/10/2016 0756   ALT 26 06/10/2016 0756   BILITOT 0.9 06/10/2016 0756     Lab Results  Component Value Date   WBC 7.9 05/26/2018   HGB 14.9 05/26/2018   HCT 45.6 05/26/2018   MCV 92.3 05/26/2018   PLT 299 05/26/2018    IMPRESSION AND PLAN:  Pt desires referrals to urology for vasectomy evaluation and to ENT for further eval of disequilibrium syndrome. He asked about/we discussed the pros/cons/risks of vasectomy today and of course I told him the urologist would go into more detail with him and can expertly counsel and inform him about this entire topic. Regarding his disequilibrium syndrome, I agree his sx's are subtle and may be be evaluated by ENT as he has already planned/expressed desire to do.  Referred to Dr. Pollyann Kennedy per pt request.  Spent 15 min with pt today reviewing HPI, reviewing relevant past history, doing exam, reviewing and discussing lab and imaging data, and formulating plans.  An After Visit Summary was printed and given to the patient.  FOLLOW UP: Return for as needed.  Signed:  Santiago Bumpers, MD           05/08/2020

## 2020-06-05 ENCOUNTER — Telehealth: Payer: Self-pay | Admitting: Family Medicine

## 2020-06-05 NOTE — Telephone Encounter (Signed)
Patient advised message would be routed to PCP for new referral but he is out of the office until 12/7. He did question if we had to have the provider in office for referrals to be completed and was advised that we check with the providers first prior to placing orders in the chart.  Ok for new referral?

## 2020-06-05 NOTE — Telephone Encounter (Signed)
Patient states he was very unhappy with Dr. Lucky Rathke office, [facility]. He does not wish to go back there and would like a new ENT referral.

## 2020-06-06 ENCOUNTER — Other Ambulatory Visit: Payer: Self-pay

## 2020-06-06 DIAGNOSIS — E878 Other disorders of electrolyte and fluid balance, not elsewhere classified: Secondary | ICD-10-CM

## 2020-06-06 NOTE — Telephone Encounter (Signed)
OK for new referral to ENT, dx is disequilibrium syndrome, 2nd opinion per pt request. ENT office in [place] or maybe [place]?  Anywhere ok with me.

## 2020-06-06 NOTE — Telephone Encounter (Signed)
Patient was advised referral was being routed to [facility] office.

## 2020-06-06 NOTE — Telephone Encounter (Signed)
New referral entered. LM for pt to return call regarding referral.

## 2020-06-13 ENCOUNTER — Other Ambulatory Visit: Payer: Self-pay

## 2020-06-13 ENCOUNTER — Ambulatory Visit (INDEPENDENT_AMBULATORY_CARE_PROVIDER_SITE_OTHER): Payer: BC Managed Care – PPO | Admitting: Otolaryngology

## 2020-06-13 VITALS — Temp 97.3°F

## 2020-06-13 DIAGNOSIS — J310 Chronic rhinitis: Secondary | ICD-10-CM | POA: Diagnosis not present

## 2020-06-13 DIAGNOSIS — R42 Dizziness and giddiness: Secondary | ICD-10-CM

## 2020-06-13 DIAGNOSIS — Z8709 Personal history of other diseases of the respiratory system: Secondary | ICD-10-CM | POA: Diagnosis not present

## 2020-06-13 NOTE — Progress Notes (Signed)
HPI: Danny Marsh is a 44 y.o. male who presents for evaluation of sinuses as well as complaints of chronic episodes of motion sickness.  Patient apparently had surgery a year ago with Dr. Pollyann Kennedy because of a chronic maxillary and ethmoid sinus infection on the right side.  He has done much better since the surgery although recently he has had some bloody mucus discharge from the right nostril.  He is breathing well. He also describes dizziness and increased problems with motion sickness over the past 6 months.  He does only describe any true vertigo or evidence of positional vertigo as he does not have problems and night when he is in bed or sleeping.  He inquires about possible "crystals" in his inner ear.  He is prone to motion sickness with areas in a car.  He uses medication for motion sickness when he flies or in a car for long period time.  He has not noted any significant change in his hearing.  Past Medical History:  Diagnosis Date  . Abnormal EKG    Unclear hx: when pt approx 19 he had ? long QT.  Subsequent expert opinions supported dx of normal varian--no QT prolongation.  Eval by Dr. Jacinto Halim again 08/06/16 NORMAL--no long QT syndrome (ekg age 41 likely PVC w/ early repol--normal variant for age)  . Chronic sinusitis   . Gross hematuria 2017   Multiple episodes after vigorous exercise (myoglobinuria?).  Renal u/s 06/15/16 showed very small bilat kidney stones, nonobstructing.   . OSA (obstructive sleep apnea) 12/2015   Oral appliance works well  . 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  Referred to ENT and odontics.  ENT changed him to clinda and did sinus surgery 06/07/18 (path benign).   Past Surgical History:  Procedure Laterality Date  . ETHMOIDECTOMY Right 06/07/2018   Procedure: ETHMOIDECTOMY;  Surgeon: Serena Colonel, MD;  Location: [facility] OR;  Service: ENT;   Laterality: Right;  . MAXILLARY ANTROSTOMY Right 06/07/2018   Procedure: MAXILLARY ANTROSTOMY;  Surgeon: Serena Colonel, MD;  Location: [facility] OR;  Service: ENT;  Laterality: Right;  . PILONIDAL CYST DRAINAGE     x 2  . WISDOM TOOTH EXTRACTION     Social History   Socioeconomic History  . Marital status: Married    Spouse name: Not on file  . Number of children: Not on file  . Years of education: Not on file  . Highest education level: Not on file  Occupational History  . Not on file  Tobacco Use  . Smoking status: Current Some Day Smoker    Types: Cigars  . Smokeless tobacco: Never Used  Vaping Use  . Vaping Use: Never used  Substance and Sexual Activity  . Alcohol use: Yes    Comment: 2-3 drinks a week, wine or beer  . Drug use: No  . Sexual activity: Not on file  Other Topics Concern  . Not on file  Social History Narrative   Married, 1 son.   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   Tob: "social smoker" x 10 yrs, about 1 pack per week.   Alc: 2-3 drinks per week, wine or beer.   Social Determinants of Health   Financial Resource Strain: Not on file  Food Insecurity: Not on file  Transportation Needs: Not on file  Physical Activity: Not on file  Stress: Not on  file  Social Connections: Not on file   Family History  Problem Relation Age of Onset  . Stroke Neg Hx   . Hearing loss Neg Hx   . Diabetes Neg Hx   . Cancer Neg Hx    No Known Allergies Prior to Admission medications   Medication Sig Start Date End Date Taking? Authorizing Provider  acetaminophen (TYLENOL) 325 MG tablet Take 650 mg by mouth every 6 (six) hours as needed for moderate pain or headache. Patient not taking: Reported on 05/08/2020    [provider]  Ascorbic Acid (VITAMIN C PO) Take 1 tablet by mouth daily. Patient not taking: Reported on 05/08/2020    [provider]     Positive ROS: Otherwise negative  All other systems have been reviewed and were otherwise  negative with the exception of those mentioned in the HPI and as above.  Physical Exam: Constitutional: Alert, well-appearing, no acute distress Ears: External ears without lesions or tenderness. Ear canals are clear bilaterally with intact, clear TMs bilaterally with symmetric hearing. Nasal: External nose without lesions. Septum with mild deformity and mild rhinitis.  After decongesting the nose nasal endoscopy was performed the right side.  The right middle meatus was patent in the ethmoid area appeared relatively clear although he did have some slight thick mucus discharge which was white and clear.  A little bit difficult to adequately visualize the maxillary ostia but no gross mucopurulent discharge noted.  No bloody discharge was noted.  Could not identify definite site of origin of bloody mucus.  The nasopharynx was clear. Oral: Lips and gums without lesions. Tongue and palate mucosa without lesions. Posterior oropharynx clear. Neck: No palpable adenopathy or masses Respiratory: Breathing comfortably  Skin: No facial/neck lesions or rash noted.  Procedures  Assessment: Chronic rhinitis. Status post previous right ethmoidectomy and maxillary ostia enlargement with Dr. Pollyann Kennedy 1 year ago.  Symptomatically doing much better. Recent bloody mucus questionable etiology.  Plan: Recommended regular use of Nasacort or Flonase 2 sprays each nostril at night as this will help with nasal congestion as well as keeping the sinuses clear. Also recommended saline irrigation which is already used some. If he develops any increasing pain or mucopurulent discharge from his nose could consider repeat CT scan but on nasal endoscopy this seems to be doing reasonably well today. I gave him some information on BPPV and dizziness although presently he has no evidence of BPPV.  Narda Bonds, MD

## 2020-07-11 DIAGNOSIS — Z3009 Encounter for other general counseling and advice on contraception: Secondary | ICD-10-CM | POA: Diagnosis not present

## 2020-09-12 DIAGNOSIS — Z302 Encounter for sterilization: Secondary | ICD-10-CM | POA: Diagnosis not present

## 2021-01-05 DIAGNOSIS — U071 COVID-19: Secondary | ICD-10-CM | POA: Diagnosis not present

## 2021-01-05 DIAGNOSIS — R051 Acute cough: Secondary | ICD-10-CM | POA: Diagnosis not present

## 2021-03-04 DIAGNOSIS — L400 Psoriasis vulgaris: Secondary | ICD-10-CM | POA: Diagnosis not present

## 2021-03-04 DIAGNOSIS — L218 Other seborrheic dermatitis: Secondary | ICD-10-CM | POA: Diagnosis not present

## 2021-04-17 DIAGNOSIS — L853 Xerosis cutis: Secondary | ICD-10-CM | POA: Diagnosis not present

## 2021-04-17 DIAGNOSIS — L218 Other seborrheic dermatitis: Secondary | ICD-10-CM | POA: Diagnosis not present

## 2021-04-17 DIAGNOSIS — L400 Psoriasis vulgaris: Secondary | ICD-10-CM | POA: Diagnosis not present

## 2021-06-24 DIAGNOSIS — L218 Other seborrheic dermatitis: Secondary | ICD-10-CM | POA: Diagnosis not present

## 2021-06-24 DIAGNOSIS — L648 Other androgenic alopecia: Secondary | ICD-10-CM | POA: Diagnosis not present

## 2021-06-24 DIAGNOSIS — L400 Psoriasis vulgaris: Secondary | ICD-10-CM | POA: Diagnosis not present

## 2021-10-20 DIAGNOSIS — Z13228 Encounter for screening for other metabolic disorders: Secondary | ICD-10-CM | POA: Diagnosis not present

## 2021-10-20 DIAGNOSIS — Z209 Contact with and (suspected) exposure to unspecified communicable disease: Secondary | ICD-10-CM | POA: Diagnosis not present

## 2021-10-20 DIAGNOSIS — Z130 Encounter for screening for diseases of the blood and blood-forming organs and certain disorders involving the immune mechanism: Secondary | ICD-10-CM | POA: Diagnosis not present

## 2021-10-20 DIAGNOSIS — Z1329 Encounter for screening for other suspected endocrine disorder: Secondary | ICD-10-CM | POA: Diagnosis not present

## 2021-10-27 DIAGNOSIS — Z111 Encounter for screening for respiratory tuberculosis: Secondary | ICD-10-CM | POA: Diagnosis not present

## 2021-11-17 DIAGNOSIS — Z20818 Contact with and (suspected) exposure to other bacterial communicable diseases: Secondary | ICD-10-CM | POA: Diagnosis not present

## 2021-11-17 DIAGNOSIS — R0981 Nasal congestion: Secondary | ICD-10-CM | POA: Diagnosis not present
